# Patient Record
Sex: Female | Born: 1980 | Race: White | Hispanic: No | Marital: Single | Smoking: Current every day smoker
Health system: Southern US, Community
[De-identification: ages and names within clinical notes are randomized; demographics above are authoritative.]

## PROBLEM LIST (undated history)

## (undated) DIAGNOSIS — M549 Dorsalgia, unspecified: Secondary | ICD-10-CM

## (undated) DIAGNOSIS — K589 Irritable bowel syndrome without diarrhea: Secondary | ICD-10-CM

## (undated) DIAGNOSIS — E282 Polycystic ovarian syndrome: Secondary | ICD-10-CM

## (undated) DIAGNOSIS — F329 Major depressive disorder, single episode, unspecified: Secondary | ICD-10-CM

## (undated) DIAGNOSIS — M25569 Pain in unspecified knee: Secondary | ICD-10-CM

## (undated) DIAGNOSIS — G8929 Other chronic pain: Secondary | ICD-10-CM

## (undated) DIAGNOSIS — M5416 Radiculopathy, lumbar region: Secondary | ICD-10-CM

## (undated) DIAGNOSIS — F419 Anxiety disorder, unspecified: Secondary | ICD-10-CM

## (undated) DIAGNOSIS — F32A Depression, unspecified: Secondary | ICD-10-CM

## (undated) DIAGNOSIS — T7840XA Allergy, unspecified, initial encounter: Secondary | ICD-10-CM

## (undated) HISTORY — PX: APPENDECTOMY: SHX54

## (undated) HISTORY — DX: Allergy, unspecified, initial encounter: T78.40XA

---

## 2003-02-13 HISTORY — PX: SPINE SURGERY: SHX786

## 2003-09-01 ENCOUNTER — Ambulatory Visit (HOSPITAL_COMMUNITY): Admission: RE | Admit: 2003-09-01 | Discharge: 2003-09-02 | Payer: Self-pay | Admitting: Neurological Surgery

## 2003-12-13 ENCOUNTER — Encounter: Admission: RE | Admit: 2003-12-13 | Discharge: 2003-12-13 | Payer: Self-pay | Admitting: Neurological Surgery

## 2005-01-31 ENCOUNTER — Emergency Department (HOSPITAL_COMMUNITY): Admission: EM | Admit: 2005-01-31 | Discharge: 2005-01-31 | Payer: Self-pay | Admitting: Emergency Medicine

## 2005-03-01 ENCOUNTER — Encounter: Admission: RE | Admit: 2005-03-01 | Discharge: 2005-03-01 | Payer: Self-pay | Admitting: Neurology

## 2006-03-08 ENCOUNTER — Ambulatory Visit: Payer: Self-pay | Admitting: Psychiatry

## 2006-03-08 ENCOUNTER — Inpatient Hospital Stay (HOSPITAL_COMMUNITY): Admission: EM | Admit: 2006-03-08 | Discharge: 2006-03-10 | Payer: Self-pay | Admitting: Psychiatry

## 2006-10-26 ENCOUNTER — Emergency Department: Payer: Self-pay | Admitting: Unknown Physician Specialty

## 2007-07-10 ENCOUNTER — Emergency Department (HOSPITAL_COMMUNITY): Admission: EM | Admit: 2007-07-10 | Discharge: 2007-07-10 | Payer: Self-pay | Admitting: Emergency Medicine

## 2007-07-23 ENCOUNTER — Emergency Department (HOSPITAL_COMMUNITY): Admission: EM | Admit: 2007-07-23 | Discharge: 2007-07-23 | Payer: Self-pay | Admitting: Emergency Medicine

## 2008-02-11 ENCOUNTER — Emergency Department (HOSPITAL_COMMUNITY): Admission: EM | Admit: 2008-02-11 | Discharge: 2008-02-11 | Payer: Self-pay | Admitting: Emergency Medicine

## 2008-05-06 ENCOUNTER — Emergency Department (HOSPITAL_COMMUNITY): Admission: EM | Admit: 2008-05-06 | Discharge: 2008-05-06 | Payer: Self-pay | Admitting: Emergency Medicine

## 2008-05-26 ENCOUNTER — Emergency Department (HOSPITAL_COMMUNITY): Admission: EM | Admit: 2008-05-26 | Discharge: 2008-05-26 | Payer: Self-pay | Admitting: Family Medicine

## 2008-06-05 ENCOUNTER — Encounter: Admission: RE | Admit: 2008-06-05 | Discharge: 2008-06-05 | Payer: Self-pay | Admitting: Internal Medicine

## 2008-06-09 ENCOUNTER — Observation Stay (HOSPITAL_COMMUNITY): Admission: EM | Admit: 2008-06-09 | Discharge: 2008-06-09 | Payer: Self-pay | Admitting: Family Medicine

## 2008-06-28 ENCOUNTER — Emergency Department (HOSPITAL_COMMUNITY): Admission: EM | Admit: 2008-06-28 | Discharge: 2008-06-28 | Payer: Self-pay | Admitting: Emergency Medicine

## 2008-08-24 ENCOUNTER — Emergency Department (HOSPITAL_COMMUNITY): Admission: EM | Admit: 2008-08-24 | Discharge: 2008-08-24 | Payer: Self-pay | Admitting: Emergency Medicine

## 2010-03-05 ENCOUNTER — Encounter: Payer: Self-pay | Admitting: Neurology

## 2010-06-30 NOTE — Op Note (Signed)
NAME:  [name], [name]                          ACCOUNT NO.:  [id number]   MEDICAL RECORD NO.:  [id number]                   PATIENT TYPE:  OIB   LOCATION:  NA                                   FACILITY:  [facility]   PHYSICIAN:  Tia Alert, MD                  DATE OF BIRTH:  [birth year]   DATE OF PROCEDURE:  DATE OF DISCHARGE:                                 OPERATIVE REPORT   PREOPERATIVE DIAGNOSIS:  Lumbar disk herniation, L5-S1, on the left, with  left S1 radiculopathy.   POSTOPERATIVE DIAGNOSIS:  Lumbar disk herniation, L5-S1, on the left, with  left S1 radiculopathy.   PROCEDURE:  Lumbar hemilaminotomy, medial facetectomy, and foraminotomy, L5-  S1 on the left, followed by microdiskectomy, L5-S1 on the left, utilizing  microscopic dissection.   SURGEON:  Tia Alert, MD.   ASSISTANT:  Donalee Citrin, MD.   ANESTHESIA:  General endotracheal.   COMPLICATIONS:  None apparent.   INDICATIONS FOR PROCEDURE:  [name] is a 30 year old white female, who  presented to the [facility] with the complaints of severe left  lower extremity pain in an S1 distribution.  She had an MRI, which showed a  herniated disk at L5-S1 on the left with compression of the left S1 nerve  root.  She had tried medical management for quite some time without any  significant relief.  Her pain was quite severe and was interfering with her  quality of life.  Despite her young age, I felt she was best treated with a  microdiskectomy at L5-S1 on the left side.  She understood the risks, the  benefits, and alternatives to the surgery and wished to proceed.   DESCRIPTION OF PROCEDURE:  The patient was taken to the operating room and  after induction of adequate general endotracheal anesthesia, she was rolled  into the prone position onto the Wilson frame and all pressure points were  padded.  Her lumbar region was prepped with Duraprep and then draped in the  usual sterile fashion, 10 mL of local  anesthesia was injected, and then a  dorsal midline incision was made and carried down to the lumbosacral fascia,  which was opened, and the paraspinous musculature was taken down in a  subperiosteal fashion to expose the L5-S1 interspace on the left side.  Intraoperative x-ray confirmed our level at L5-S1 on the left, and then a  hemilaminotomy, a medial facetectomy, and foraminotomy were performed at L5-  S1 on the left utilizing the Kerrison punch.  The __________ligament was  identified, opened, and removed to expose the underlying dura and a left S1  nerve root.  The nerve root was retracted medially, and the epidural venous  vasculature was coagulated and cut sharply.  A large subannular disk  herniation was then identified and the annulus was incised, and a thorough  intradiskal diskectomy was performed with pituitary rongeurs  and curettes.  Once the diskectomy was complete, we used a nerve hook and a coronary  dilator to palpate to assure there were no more free fragments.  There was  no more nerve root compression, and the nerve root was free and pulsatile.  We irrigated with copious amounts of Bacitracin containing saline solution,  then lined the dura with Duragen, removed the retractor, and closed the  fascia with interrupted #1 Vicryl.  We closed the subcutaneous and  subcuticular tissue with 2-0 and 3-0 Vicryl, and closed the  skin with Dermabond.  The drapes were removed, a sterile dressing was  applied, the patient was awakened from general anesthesia and transported to  the recovery room in stable condition.  At the end of the procedure, all  sponge, needle, and instrument counts were correct.                                               Tia Alert, MD    DSJ/MEDQ  D:  09/01/2003  T:  09/01/2003  Job:  [id number]

## 2010-06-30 NOTE — Discharge Summary (Signed)
NAME:  Kristin, Alvarez                ACCOUNT NO.:  [id number]   MEDICAL RECORD NO.:  [id number]          PATIENT TYPE:  IPS   LOCATION:  0503                          [facility]   PHYSICIAN:  Anselm Jungling, MD  DATE OF BIRTH:  05/03/1980   DATE OF ADMISSION:  03/08/2006  DATE OF DISCHARGE:  03/10/2006                               DISCHARGE SUMMARY   IDENTIFYING DATA/REASON FOR ADMISSION:  This was an inpatient  psychiatric admission for [name], a 30 year old single female who was  admitted due to severe and increasing symptoms of anxiety and panic.  She likened the sensation to an elephant sitting on my chest.  She had  had some treatment with antidepressant medication without good results.   The patient was admitted because she had verbalized the presence of some  suicidal thoughts.  However, she denied any actual suicide plan or  intent.  Please refer to the admission note for further details  pertaining to the symptoms, circumstances, and history that led to her  hospitalization.  She was given an initial Axis I diagnosis of anxiety  disorder, NOS.   MEDICAL AND LABORATORY:  The patient was medically and physically  assessed by the psychiatric physician's assistant.  He noted an increase  in 30 pounds over 7 months, it was not clear what this was attributable  to but we could not rule out the possibility that it might have been due  to a response to psychotropic medications.  The patient had had a  history of strep throat 3 weeks prior.  She had a history of asthma,  irritable bowel syndrome, irregular menses, back pain, and frequent  sinus and tension headaches.   On admission, her white count was noted to be mildly elevated at 11,200  but other laboratory were essentially within normal limits.  She  appeared to be in good health without any active or chronic medical  problems.  There were no significant medical issues during this brief  inpatient stay.   HOSPITAL  COURSE:  The patient was admitted to the Adult Inpatient  Psychiatric Service.  She presented as a well-nourished, well-developed,  young woman who was very well organized.  She appeared to be of above-  average intelligence, was quite articulate and expressive.  She was  initially evaluated by Dr. Milford Cage.  In the initial assessment,  she described her anxiety symptoms to Dr. Katrinka Blazing as well as her  depressive symptoms.  Dr. Katrinka Blazing suggested a trial of Vistaril t.i.d. to  address anxiety symptoms.  The patient had admitted some occasional  marijuana use to Dr. Katrinka Blazing and apparently, because of this, Dr. Katrinka Blazing  was uncomfortable prescribing any benzodiazepines though the patient had  stated that of Valium, when given on occasion in the past, had helped  her anxiety greatly.  Dr. Katrinka Blazing noted that the patient was argumentative  about her decision not to prescribe benzodiazepines.   On the second full hospital day, Dr. Katrinka Blazing asked the undersigned if I  would be willing to see the patient that day owing to the patient having  made many subsequent verbalizations indicating her displeasure with Dr.  Katrinka Blazing.  I consented to do so and, when I approached the patient, she  indicated that she was willing to speak with me instead of Dr. Katrinka Blazing.  By this time, the patient had been talking to staff about her desire for  discharge that day, March 10, 2006.   I met with the patient face-to-face for approximately 45 minutes.  We  discussed all aspects of her treatment.  She indicated a great deal of  dissatisfaction of frustration around her hospital course including not  feeling that she was oriented properly to the inpatient service on  arrival.  Mostly, we talked about the psychotropic medications that had  been prescribed for her and answered questions that she had about these.   She stated that she did not find Vistaril helpful for her anxiety and  that it gave her a very dry mouth.  She had also  been prescribed  Seroquel, 25 mg at bedtime.  We discussed the pros and cons of  continuing this.   We discussed the Lamictal, 25 mg, that the patient had been initiated on  on a trial basis as well as pros and cons of this.   The patient and I discussed her marijuana usage.  She described it is  something that was very occasional, only something that she did once in  awhile at parties, but she did not have any pattern of buying, keeping,  or using marijuana on any regular basis.  She denied abuse of other  drugs and alcohol.  She mentioned that substance abuse runs rampant in  her family, she is very aware of it and very concerned about the  possibility that she could become chemically dependent but indicated  that she does not see herself as chemically dependent.   We discussed her past response to Valium, which she stated was very good  at a relatively low dose of 2.5 mg per dose.  She had not used Valium on  any extended basis.  I told the patient that, based on what she had told  me about her marijuana usage and her general stance towards drugs and  alcohol, that I did not believe that she was actually a chemically  dependent person and that Valium might be a consideration for her, or  some other equivalent benzodiazepine.  However, at this point the  patient was talking about being discharged that day.  I explained to her  that I did not feel comfortable writing her a prescription for Valium  and sending her home without the medication's effects being tested and  observed in the inpatient setting.  The patient had also been talking  about wanting to return to work the following morning.  I told her that,  if I did prescribe Valium or some other benzodiazepine for her, I could  not guarantee that she would be in proper shape to function at work or  drive herself safely to work the following morning.  The patient agreed  with this precaution.  I reassured the patient that we will  be setting up a referral to a  psychiatrist for medication management as soon as it could be arranged.  (We were speaking on a Sunday and I informed her that our case manager  would make such appointments the following day, Monday, and call her.)   At the end of her conversation, the patient and I agreed that she would  be sent  home with only 2 medications, Seroquel and Ambien.  We agreed  that she could discuss the possibility of a benzodiazepine trial and/or  Lamictal trial with her psychiatrist, to be arranged.  The patient had  tolerated the 25 mg of Seroquel without any undue sedation the following  morning.  I instructed her to take Seroquel, 25 mg at bedtime, and  increase to 50 mg at bedtime as needed for better sleep.  I also  indicated that she could use Ambien, 10 mg, but only as needed for sleep  and that my recommendation was to utilize Seroquel to the 50 mg dose  first before combining the 2.  The patient agreed to this.   The patient was discharged following our discussion.  Although she felt  better overall about things after our talk, she indicated that she still  felt quite frustrated about various particulars having to do with her  care while she was hospitalized with [place].  She requested, and I gave her  the names of our Passenger transport manager and our director of nursing.  She indicated that she would probably contact them to discuss her  concerns.   AFTERCARE:  As the patient was discharged on a Sunday, it was not  possible to give her specific appointment dates and times but the  patient was told that our case manager would do so on the following day,  on Monday.  The patient gave me her cell phone number, which I  subsequently forwarded to the appropriate case manager.   DISCHARGE MEDICATIONS:  1. Seroquel 25 mg q.h.s. increased to 50 mg as needed for insomnia.  2. Ambien 10 mg only as needed for sleep if Seroquel not effective.   DISCHARGE DIAGNOSES:  AXIS  I: Anxiety disorder, not otherwise specified.  AXIS II: Deferred.  AXIS III: No acute or chronic illnesses.  AXIS IV: Stressors severe.  AXIS V: Global Assessment of Functioning (GAF) on discharge 80.      Anselm Jungling, MD  Electronically Signed     SPB/MEDQ  D:  03/11/2006  T:  03/11/2006  Job:  [id number]

## 2010-11-17 LAB — URINALYSIS, ROUTINE W REFLEX MICROSCOPIC
Bilirubin Urine: NEGATIVE
Glucose, UA: NEGATIVE mg/dL
Hgb urine dipstick: NEGATIVE
Ketones, ur: NEGATIVE mg/dL
Nitrite: NEGATIVE
Protein, ur: NEGATIVE mg/dL
Specific Gravity, Urine: 1.01 (ref 1.005–1.030)
Urobilinogen, UA: 0.2 mg/dL (ref 0.0–1.0)
pH: 6.5 (ref 5.0–8.0)

## 2010-11-17 LAB — URINE CULTURE
Colony Count: NO GROWTH
Culture: NO GROWTH

## 2010-11-17 LAB — POCT PREGNANCY, URINE: Preg Test, Ur: NEGATIVE

## 2011-12-22 ENCOUNTER — Encounter (HOSPITAL_COMMUNITY): Payer: Self-pay | Admitting: Emergency Medicine

## 2011-12-22 ENCOUNTER — Emergency Department (HOSPITAL_COMMUNITY)
Admission: EM | Admit: 2011-12-22 | Discharge: 2011-12-22 | Disposition: A | Discharge status: Home Health | Payer: BC Managed Care – PPO | Attending: Emergency Medicine | Admitting: Emergency Medicine

## 2011-12-22 DIAGNOSIS — F411 Generalized anxiety disorder: Secondary | ICD-10-CM | POA: Insufficient documentation

## 2011-12-22 DIAGNOSIS — Z9889 Other specified postprocedural states: Secondary | ICD-10-CM | POA: Insufficient documentation

## 2011-12-22 DIAGNOSIS — M5416 Radiculopathy, lumbar region: Secondary | ICD-10-CM

## 2011-12-22 DIAGNOSIS — G8929 Other chronic pain: Secondary | ICD-10-CM | POA: Insufficient documentation

## 2011-12-22 DIAGNOSIS — F3289 Other specified depressive episodes: Secondary | ICD-10-CM | POA: Insufficient documentation

## 2011-12-22 DIAGNOSIS — F329 Major depressive disorder, single episode, unspecified: Secondary | ICD-10-CM | POA: Insufficient documentation

## 2011-12-22 DIAGNOSIS — F17200 Nicotine dependence, unspecified, uncomplicated: Secondary | ICD-10-CM | POA: Insufficient documentation

## 2011-12-22 DIAGNOSIS — IMO0002 Reserved for concepts with insufficient information to code with codable children: Secondary | ICD-10-CM | POA: Insufficient documentation

## 2011-12-22 HISTORY — DX: Anxiety disorder, unspecified: F41.9

## 2011-12-22 HISTORY — DX: Major depressive disorder, single episode, unspecified: F32.9

## 2011-12-22 HISTORY — DX: Depression, unspecified: F32.A

## 2011-12-22 MED ORDER — HYDROCODONE-ACETAMINOPHEN 5-500 MG PO TABS: 1.0000 | ORAL_TABLET | Freq: Four times a day (QID) | ORAL | Status: DC | PRN

## 2011-12-22 MED ORDER — HYDROMORPHONE HCL PF 2 MG/ML IJ SOLN
2.0000 mg | Freq: Once | INTRAMUSCULAR | Status: AC
Start: 2011-12-22 — End: 2011-12-22
  Administered 2011-12-22: 2 mg via INTRAMUSCULAR
  Filled 2011-12-22: qty 1

## 2011-12-22 NOTE — ED Notes (Signed)
Pt. Stated, i was playing with a child and I hurt my back which I have problems with it.  I've tried heat, ice, everything and nothing helps.

## 2011-12-22 NOTE — ED Provider Notes (Addendum)
History    This chart was scribed for Kristin Sprout, MD, MD by [name], ED Scribe. The patient was seen in room TR11C and the patient's care was started at 1:29PM.   CSN: [id number]  Arrival date & time 12/22/11  1301       Chief Complaint  Patient presents with  . Back Pain     Patient is a 31 y.o. female presenting with back pain. The history is provided by the patient. No language interpreter was used.  Back Pain  Pertinent negatives include no fever and no weakness.   [name] is a 32 y.o. female with hx of chronic back pain who presents to the Emergency Department complaining of constant, moderate lower back pain radiating to left ankle onset 2 days ago. Pt reports that she has surgery on lumbar spine in the past. She reports having nerve damage in bilateral feet. She denies bowel and urinary incontinence. She states that she has spasms on left side of back. Pt has used heat and ice without relief.    Dr. Yetta Barre ortho surgeon   Past Medical History  Diagnosis Date  . Depression   . Anxiety     History reviewed. No pertinent past surgical history.  No family history on file.  History  Substance Use Topics  . Smoking status: Current Every Day Smoker  . Smokeless tobacco: Not on file  . Alcohol Use: Yes    OB History    Grav Para Term Preterm Abortions TAB SAB Ect Mult Living                  Review of Systems  Constitutional: Negative for fever and chills.  Respiratory: Negative for shortness of breath.   Gastrointestinal: Negative for nausea and vomiting.  Musculoskeletal: Positive for back pain.  Neurological: Negative for weakness.  All other systems reviewed and are negative.    Allergies  Review of patient's allergies indicates not on file.  Home Medications  No current outpatient prescriptions on file.  BP 129/76  Pulse 105  Temp 98 F (36.7 C) (Oral)  Resp 22  SpO2 98%  LMP 12/15/2011  Physical Exam  Nursing note and vitals  reviewed. Constitutional: She is oriented to person, place, and time. She appears well-developed and well-nourished. No distress.  HENT:  Head: Normocephalic and atraumatic.  Eyes: EOM are normal.  Neck: Neck supple. No tracheal deviation present.  Cardiovascular: Normal rate.   Pulmonary/Chest: Effort normal. No respiratory distress.  Musculoskeletal: Normal range of motion.       l-spine and paraspinal tenderness Nl strength  Nl sensation +1  patellar reflex +2 DP and PT   Neurological: She is alert and oriented to person, place, and time.  Skin: Skin is warm and dry.  Psychiatric: She has a normal mood and affect. Her behavior is normal.    ED Course  Procedures (including critical care time) DIAGNOSTIC STUDIES: Oxygen Saturation is 98% on room air, normal by my interpretation.    COORDINATION OF CARE: 1:31 PM Discussed ED treatment with pt     Labs Reviewed - No data to display No results found.   1. Lumbar radiculopathy       MDM   Pt with gradual onset of back pain suggestive of radiculopathy.  No neurovascular compromise and no incontinence.  Pt has no infectious sx, hx of CA  or other red flags concerning for pathologic back pain.  Pt is able to ambulate but is painful.  Normal strength and reflexes on exam.  Denies trauma. Will give pt pain control and to return for developement of above sx.       I personally performed the services described in this documentation, which was scribed in my presence.  The recorded information has been reviewed and considered.    Kristin Sprout, MD 12/22/11 1339  Kristin Sprout, MD 12/22/11 1340

## 2011-12-25 ENCOUNTER — Ambulatory Visit: Payer: BC Managed Care – PPO | Admitting: Internal Medicine

## 2011-12-25 ENCOUNTER — Ambulatory Visit: Payer: BC Managed Care – PPO

## 2011-12-25 VITALS — BP 120/78 | HR 82 | Temp 98.4°F | Resp 18 | Ht 68.0 in | Wt 261.2 lb

## 2011-12-25 DIAGNOSIS — Z7189 Other specified counseling: Secondary | ICD-10-CM

## 2011-12-25 DIAGNOSIS — R202 Paresthesia of skin: Secondary | ICD-10-CM

## 2011-12-25 DIAGNOSIS — M545 Other chronic pain: Secondary | ICD-10-CM | POA: Insufficient documentation

## 2011-12-25 DIAGNOSIS — G8929 Other chronic pain: Secondary | ICD-10-CM | POA: Insufficient documentation

## 2011-12-25 DIAGNOSIS — M549 Dorsalgia, unspecified: Secondary | ICD-10-CM

## 2011-12-25 DIAGNOSIS — R209 Unspecified disturbances of skin sensation: Secondary | ICD-10-CM

## 2011-12-25 LAB — POCT URINALYSIS DIPSTICK
Bilirubin, UA: NEGATIVE
Glucose, UA: NEGATIVE
Ketones, UA: NEGATIVE
Leukocytes, UA: NEGATIVE
Nitrite, UA: NEGATIVE
Protein, UA: NEGATIVE
Spec Grav, UA: 1.025
Urobilinogen, UA: 0.2
pH, UA: 5.5

## 2011-12-25 LAB — POCT UA - MICROSCOPIC ONLY
Bacteria, U Microscopic: NEGATIVE
Casts, Ur, LPF, POC: NEGATIVE
Crystals, Ur, HPF, POC: NEGATIVE
Mucus, UA: NEGATIVE
Yeast, UA: NEGATIVE

## 2011-12-25 LAB — POCT URINE PREGNANCY: Preg Test, Ur: NEGATIVE

## 2011-12-25 MED ORDER — HYDROCODONE-ACETAMINOPHEN 7.5-325 MG PO TABS: 1.0000 | ORAL_TABLET | Freq: Three times a day (TID) | ORAL | Status: DC | PRN

## 2011-12-25 NOTE — Progress Notes (Signed)
  Subjective:    Patient ID: [name], female    DOB: 06-Jan-1981, 31 y.o.   MRN: [id number]  HPI Has chronic recurrent low back pain with radiation down left leg. Had surgery 11 years ago, had some permanent foot numbness and some bladder problems. Dr. Sandria Manly was her neurology and Dr. Yetta Barre her NS at that time. She requests referral to a spine Dr. Milinda Antis not regular, no incontinence In school and needs to finish in next 1 month Review of Systems     Objective:   Physical Exam  Vitals reviewed. Constitutional: She appears well-nourished. She appears distressed.  Eyes: EOM are normal.  Pulmonary/Chest: Effort normal.      [facility] reading (PRIMARY) by  Dr.Guest.Mild DDD, hx of surgery Results for orders placed in visit on 12/25/11  POCT URINE PREGNANCY      Component Value Range   Preg Test, Ur Negative    POCT UA - MICROSCOPIC ONLY      Component Value Range   WBC, Ur, HPF, POC 1-2     RBC, urine, microscopic 0-1     Bacteria, U Microscopic neg     Mucus, UA neg     Epithelial cells, urine per micros 4-6     Crystals, Ur, HPF, POC neg     Casts, Ur, LPF, POC neg     Yeast, UA neg    POCT URINALYSIS DIPSTICK      Component Value Range   Color, UA yellow     Clarity, UA clear     Glucose, UA neg     Bilirubin, UA neg     Ketones, UA neg     Spec Grav, UA 1.025     Blood, UA moderate     pH, UA 5.5     Protein, UA neg     Urobilinogen, UA 0.2     Nitrite, UA neg     Leukocytes, UA Negative         Assessment & Plan:  Refer to Dr. Ethelene Hal Back manual HC 7.5

## 2012-03-17 ENCOUNTER — Emergency Department (HOSPITAL_COMMUNITY)
Admission: EM | Admit: 2012-03-17 | Discharge: 2012-03-17 | Disposition: A | Discharge status: Home / Self Care | Payer: BC Managed Care – PPO | Attending: Emergency Medicine | Admitting: Emergency Medicine

## 2012-03-17 ENCOUNTER — Encounter (HOSPITAL_COMMUNITY): Payer: Self-pay | Admitting: Emergency Medicine

## 2012-03-17 ENCOUNTER — Emergency Department (HOSPITAL_COMMUNITY): Payer: BC Managed Care – PPO

## 2012-03-17 DIAGNOSIS — Y9301 Activity, walking, marching and hiking: Secondary | ICD-10-CM | POA: Insufficient documentation

## 2012-03-17 DIAGNOSIS — F3289 Other specified depressive episodes: Secondary | ICD-10-CM | POA: Insufficient documentation

## 2012-03-17 DIAGNOSIS — F411 Generalized anxiety disorder: Secondary | ICD-10-CM | POA: Insufficient documentation

## 2012-03-17 DIAGNOSIS — S8990XA Unspecified injury of unspecified lower leg, initial encounter: Secondary | ICD-10-CM | POA: Insufficient documentation

## 2012-03-17 DIAGNOSIS — M25561 Pain in right knee: Secondary | ICD-10-CM

## 2012-03-17 DIAGNOSIS — G8929 Other chronic pain: Secondary | ICD-10-CM | POA: Insufficient documentation

## 2012-03-17 DIAGNOSIS — Z79899 Other long term (current) drug therapy: Secondary | ICD-10-CM | POA: Insufficient documentation

## 2012-03-17 DIAGNOSIS — Y92009 Unspecified place in unspecified non-institutional (private) residence as the place of occurrence of the external cause: Secondary | ICD-10-CM | POA: Insufficient documentation

## 2012-03-17 DIAGNOSIS — F17200 Nicotine dependence, unspecified, uncomplicated: Secondary | ICD-10-CM | POA: Insufficient documentation

## 2012-03-17 DIAGNOSIS — W010XXA Fall on same level from slipping, tripping and stumbling without subsequent striking against object, initial encounter: Secondary | ICD-10-CM | POA: Insufficient documentation

## 2012-03-17 DIAGNOSIS — F329 Major depressive disorder, single episode, unspecified: Secondary | ICD-10-CM | POA: Insufficient documentation

## 2012-03-17 HISTORY — DX: Other chronic pain: G89.29

## 2012-03-17 HISTORY — DX: Radiculopathy, lumbar region: M54.16

## 2012-03-17 HISTORY — DX: Dorsalgia, unspecified: M54.9

## 2012-03-17 HISTORY — DX: Pain in unspecified knee: M25.569

## 2012-03-17 MED ORDER — NAPROXEN 250 MG PO TABS: 250.0000 mg | ORAL_TABLET | Freq: Two times a day (BID) | ORAL | Status: DC

## 2012-03-17 MED ORDER — KETOROLAC TROMETHAMINE 60 MG/2ML IM SOLN
60.0000 mg | Freq: Once | INTRAMUSCULAR | Status: AC
  Administered 2012-03-17: 60 mg via INTRAMUSCULAR
  Filled 2012-03-17: qty 2

## 2012-03-17 NOTE — ED Provider Notes (Signed)
History     CSN: [id number]  Arrival date & time 03/17/12  0328   First MD Initiated Contact with Patient 03/17/12 0345      Chief Complaint  Patient presents with  . Knee Injury     HPI Pt was seen at 0435.   Per pt, c/o gradual onset and persistence of constant acute flair of her chronic right knee "pain" for the past 2 days.  States the pain began when she "slipped on some ice" and "twisted funny."  Describes the pain as "spasms in my whole leg."  Denies focal motor weakness, no tingling/numbness in extremity, no open wounds, no edema, no ecchymosis, no fevers.      Past Medical History  Diagnosis Date  . Depression   . Anxiety   . Allergy   . Chronic back pain   . Chronic knee pain   . Lumbar radiculopathy     Past Surgical History  Procedure Date  . Appendectomy   . Spine surgery 2005    Family History  Problem Relation Age of Onset  . Hypertension Mother     History  Substance Use Topics  . Smoking status: Current Every Day Smoker -- 0.5 packs/day  . Smokeless tobacco: Not on file  . Alcohol Use: No    Review of Systems ROS: Statement: All systems negative except as marked or noted in the HPI; Constitutional: Negative for fever and chills. ; ; Eyes: Negative for eye pain, redness and discharge. ; ; ENMT: Negative for ear pain, hoarseness, nasal congestion, sinus pressure and sore throat. ; ; Cardiovascular: Negative for chest pain, palpitations, diaphoresis, dyspnea and peripheral edema. ; ; Respiratory: Negative for cough, wheezing and stridor. ; ; Gastrointestinal: Negative for nausea, vomiting, diarrhea, abdominal pain, blood in stool, hematemesis, jaundice and rectal bleeding. . ; ; Genitourinary: Negative for dysuria, flank pain and hematuria. ; ; Musculoskeletal: +right knee pain. Negative for back pain and neck pain. Negative for swelling and deformity.; ; Skin: Negative for pruritus, rash, abrasions, blisters, bruising and skin lesion.; ; Neuro: Negative  for headache, lightheadedness and neck stiffness. Negative for weakness, altered level of consciousness , altered mental status, extremity weakness, paresthesias, involuntary movement, seizure and syncope.       Allergies  Other  Home Medications   Current Outpatient Rx  Name  Route  Sig  Dispense  Refill  . CITALOPRAM HYDROBROMIDE 40 MG PO TABS   Oral   Take 40 mg by mouth daily.         Marland Kitchen DIAZEPAM 5 MG PO TABS   Oral   Take 2.5-5 mg by mouth every 12 (twelve) hours as needed. For anxiety/sleep         . OMEGA-3 FATTY ACIDS 1000 MG PO CAPS   Oral   Take 1 g by mouth 2 (two) times daily.         Marland Kitchen HYDROCODONE-ACETAMINOPHEN 7.5-325 MG PO TABS   Oral   Take 1 tablet by mouth every 8 (eight) hours as needed for pain.   45 tablet   1   . HYDROCODONE-ACETAMINOPHEN 5-500 MG PO TABS   Oral   Take 1-2 tablets by mouth every 6 (six) hours as needed for pain.   15 tablet   0   . ADULT MULTIVITAMIN W/MINERALS CH   Oral   Take 1 tablet by mouth daily.         Marland Kitchen NAPROXEN 250 MG PO TABS   Oral   Take 1  tablet (250 mg total) by mouth 2 (two) times daily with a meal.   14 tablet   0   . ZOLPIDEM TARTRATE 10 MG PO TABS   Oral   Take 15 mg by mouth at bedtime as needed. For insomnia           Wt 256 lb (116.121 kg)  LMP 03/07/2012  Physical Exam 0440: Physical examination:  Nursing notes reviewed; Vital signs and O2 SAT reviewed;  Constitutional: Well developed, Well nourished, Well hydrated, In no acute distress; Head:  Normocephalic, atraumatic; Eyes: EOMI, PERRL, No scleral icterus; ENMT: Mouth and pharynx normal, Mucous membranes moist; Neck: Supple, Full range of motion, No lymphadenopathy; Cardiovascular: Regular rate and rhythm, No murmur, rub, or gallop; Respiratory: Breath sounds clear & equal bilaterally, No rales, rhonchi, wheezes.  Speaking full sentences with ease, Normal respiratory effort/excursion; Chest: Nontender, Movement normal; Abdomen: Soft,  Nontender, Nondistended, Normal bowel sounds; Genitourinary: No CVA tenderness; Extremities: Pulses normal, +FROM right knee, including able to lift extended RLE off stretcher, and extend right lower leg against resistance.  No ligamentous laxity.  No patellar or quad tendon step-offs.  NMS intact right foot, strong pedal pp. +plantarflexion of right foot w/calf squeeze.  No palpable gap right Achilles's tendon.  No proximal fibular head tenderness.  No edema, erythema, warmth, ecchymosis or deformity.  No specific area of point tenderness. Pelvis stable.  NT right ankle/foot. No edema, No calf edema or asymmetry.; Neuro: AA&Ox3, Major CN grossly intact.  Speech clear. Gait steady. No gross focal motor or sensory deficits in extremities.; Skin: Color normal, Warm, Dry.   ED Course  Procedures     MDM  MDM Reviewed: previous chart, nursing note and vitals Interpretation: x-ray   Dg Knee Complete 4 Views Right 03/17/2012  *RADIOLOGY REPORT*  Clinical Data: Right knee pain after fall.  RIGHT KNEE - COMPLETE 4+ VIEW  Comparison: 12/26/2009  Findings: The right knee appears intact. No evidence of acute fracture or subluxation.  No focal bone lesions.  Bone matrix and cortex appear intact.  No abnormal radiopaque densities in the soft tissues.  No significant effusion.  No significant changes since the previous study.  IMPRESSION: No acute bony abnormalities.   Original Report Authenticated By: Burman Nieves, M.D.       [phone]:  Long hx of chronic right knee pain.  Pt endorses acute flair of her usual long standing chronic pain today, no change from her usual chronic pain pattern.  Pt encouraged to f/u with her PMD, Pain Management, and Orthopedic doctor for good continuity of care and control of her chronic pain.  Verb understanding.  Will place in knee immobilizer and crutches, with Ortho MD f/u.        Laray Anger, DO 03/17/12 2033

## 2012-03-17 NOTE — ED Notes (Signed)
Pts room mate here to be seen as well. Pt states she was in a pain clinic and wants to stay off opiates.

## 2012-03-17 NOTE — ED Notes (Signed)
Pt c/o R knee pain onset Friday after slipping on ice, pt states she has old injury but now having pain radiating to hip and ankle.

## 2012-03-17 NOTE — ED Notes (Signed)
Pt stated she has crutches at home. No crutches given at this time.

## 2012-06-26 ENCOUNTER — Encounter (HOSPITAL_COMMUNITY): Payer: Self-pay | Admitting: Emergency Medicine

## 2012-06-26 ENCOUNTER — Emergency Department (HOSPITAL_COMMUNITY)
Admission: EM | Admit: 2012-06-26 | Discharge: 2012-06-26 | Disposition: A | Discharge status: Home / Self Care | Payer: BC Managed Care – PPO | Attending: Emergency Medicine | Admitting: Emergency Medicine

## 2012-06-26 DIAGNOSIS — E282 Polycystic ovarian syndrome: Secondary | ICD-10-CM

## 2012-06-26 DIAGNOSIS — IMO0002 Reserved for concepts with insufficient information to code with codable children: Secondary | ICD-10-CM | POA: Insufficient documentation

## 2012-06-26 DIAGNOSIS — Z3202 Encounter for pregnancy test, result negative: Secondary | ICD-10-CM | POA: Insufficient documentation

## 2012-06-26 DIAGNOSIS — N938 Other specified abnormal uterine and vaginal bleeding: Secondary | ICD-10-CM

## 2012-06-26 DIAGNOSIS — Z79899 Other long term (current) drug therapy: Secondary | ICD-10-CM | POA: Insufficient documentation

## 2012-06-26 DIAGNOSIS — G8929 Other chronic pain: Secondary | ICD-10-CM | POA: Insufficient documentation

## 2012-06-26 DIAGNOSIS — F329 Major depressive disorder, single episode, unspecified: Secondary | ICD-10-CM | POA: Insufficient documentation

## 2012-06-26 DIAGNOSIS — R109 Unspecified abdominal pain: Secondary | ICD-10-CM | POA: Insufficient documentation

## 2012-06-26 DIAGNOSIS — F411 Generalized anxiety disorder: Secondary | ICD-10-CM | POA: Insufficient documentation

## 2012-06-26 DIAGNOSIS — M25569 Pain in unspecified knee: Secondary | ICD-10-CM | POA: Insufficient documentation

## 2012-06-26 DIAGNOSIS — R5381 Other malaise: Secondary | ICD-10-CM | POA: Insufficient documentation

## 2012-06-26 DIAGNOSIS — F17200 Nicotine dependence, unspecified, uncomplicated: Secondary | ICD-10-CM | POA: Insufficient documentation

## 2012-06-26 DIAGNOSIS — F3289 Other specified depressive episodes: Secondary | ICD-10-CM | POA: Insufficient documentation

## 2012-06-26 DIAGNOSIS — M549 Dorsalgia, unspecified: Secondary | ICD-10-CM | POA: Insufficient documentation

## 2012-06-26 LAB — URINE MICROSCOPIC-ADD ON

## 2012-06-26 LAB — CBC WITH DIFFERENTIAL/PLATELET
Basophils Relative: 0 % (ref 0–1)
Eosinophils Absolute: 0.2 10*3/uL (ref 0.0–0.7)
Eosinophils Relative: 2 % (ref 0–5)
HCT: 38.4 % (ref 36.0–46.0)
Hemoglobin: 13.4 g/dL (ref 12.0–15.0)
Lymphs Abs: 3.4 10*3/uL (ref 0.7–4.0)
MCH: 31.5 pg (ref 26.0–34.0)
MCHC: 34.9 g/dL (ref 30.0–36.0)
MCV: 90.1 fL (ref 78.0–100.0)
Monocytes Absolute: 0.8 10*3/uL (ref 0.1–1.0)
Monocytes Relative: 7 % (ref 3–12)
Neutrophils Relative %: 61 % (ref 43–77)
RBC: 4.26 MIL/uL (ref 3.87–5.11)

## 2012-06-26 LAB — URINALYSIS, ROUTINE W REFLEX MICROSCOPIC
Bilirubin Urine: NEGATIVE
Glucose, UA: NEGATIVE mg/dL
Ketones, ur: NEGATIVE mg/dL
Leukocytes, UA: NEGATIVE
Nitrite: NEGATIVE
Protein, ur: NEGATIVE mg/dL
Specific Gravity, Urine: 1.023 (ref 1.005–1.030)
Urobilinogen, UA: 0.2 mg/dL (ref 0.0–1.0)
pH: 5.5 (ref 5.0–8.0)

## 2012-06-26 LAB — BASIC METABOLIC PANEL
BUN: 9 mg/dL (ref 6–23)
Creatinine, Ser: 0.73 mg/dL (ref 0.50–1.10)
GFR calc non Af Amer: >90 mL/min (ref >90)
Glucose, Bld: 100 mg/dL — ABNORMAL HIGH (ref 70–99)
Potassium: 4 mEq/L (ref 3.5–5.1)

## 2012-06-26 LAB — PREGNANCY, URINE: Preg Test, Ur: NEGATIVE

## 2012-06-26 LAB — WET PREP, GENITAL: Clue Cells Wet Prep HPF POC: NONE SEEN

## 2012-06-26 MED ORDER — HYDROCODONE-ACETAMINOPHEN 10-325 MG PO TABS: 1.0000 | ORAL_TABLET | Freq: Once | ORAL | Status: DC

## 2012-06-26 MED ORDER — HYDROCODONE-ACETAMINOPHEN 5-325 MG PO TABS: 2.0000 | ORAL_TABLET | Freq: Four times a day (QID) | ORAL | Status: DC | PRN

## 2012-06-26 MED ORDER — NORETHINDRONE-ETH ESTRADIOL 0.4-35 MG-MCG PO TABS: 1.0000 | ORAL_TABLET | Freq: Every day | ORAL | Status: DC

## 2012-06-26 MED ORDER — ONDANSETRON 4 MG PO TBDP
4.0000 mg | ORAL_TABLET | Freq: Once | ORAL | Status: AC
  Administered 2012-06-26: 4 mg via ORAL
  Filled 2012-06-26: qty 1

## 2012-06-26 MED ORDER — HYDROCODONE-ACETAMINOPHEN 5-325 MG PO TABS
1.0000 | ORAL_TABLET | Freq: Once | ORAL | Status: AC
  Administered 2012-06-26: 1 via ORAL
  Filled 2012-06-26: qty 1

## 2012-06-26 NOTE — ED Notes (Signed)
Pt presenting to ed with c/o vaginal bleeding x 16 hours. Pt states she has used 25 tampons and panty liners in 16 hours. Pt states she has pcos and has abnormal periods. Pt states she feels fatigue.

## 2012-06-26 NOTE — ED Provider Notes (Signed)
History     CSN: [id number]  Arrival date & time 06/26/12  1608   First MD Initiated Contact with Patient 06/26/12 1616      Chief Complaint  Patient presents with  . Vaginal Bleeding    (Consider location/radiation/quality/duration/timing/severity/associated sxs/prior treatment) HPI  Patient presents to the ED for vaginal bleeding that is severe for 16 hours. 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 She said that she normal gets light spotting every few weeks. She has never had severe  Bleeding like this before and feels weak. She has 2 tampons in and has bleed through it within 30 minutes. She describes the toilet bowel being full of blood, and large and small clots coming out in the shower. Endorses mild suprapubic cramping and her chronic low back pain.   Past Medical History  Diagnosis Date  . Depression   . Anxiety   . Allergy   . Chronic back pain   . Chronic knee pain   . Lumbar radiculopathy     Past Surgical History  Procedure Laterality Date  . Appendectomy    . Spine surgery  2005    Family History  Problem Relation Age of Onset  . Hypertension Mother     History  Substance Use Topics  . Smoking status: Current Every Day Smoker -- 0.50 packs/day    Types: Cigarettes  . Smokeless tobacco: Not on file  . Alcohol Use: No    OB History   Grav Para Term Preterm Abortions TAB SAB Ect Mult Living                  Review of Systems  Genitourinary: Positive for vaginal bleeding.  All other systems reviewed and are negative.    Allergies  Other  Home Medications   Current Outpatient Rx  Name  Route  Sig  Dispense  Refill  . albuterol (PROVENTIL HFA;VENTOLIN HFA) 108 (90 BASE) MCG/ACT inhaler   Inhalation   Inhale 2 puffs into the lungs every 6 (six) hours as needed for wheezing.         . citalopram (CELEXA) 40 MG tablet   Oral   Take 40 mg by  mouth daily.         . diazepam (VALIUM) 5 MG tablet   Oral   Take 2.5-5 mg by mouth every 12 (twelve) hours as needed. For anxiety/sleep         . fish oil-omega-3 fatty acids 1000 MG capsule   Oral   Take 1 g by mouth 2 (two) times daily.         Marland Kitchen ibuprofen (ADVIL,MOTRIN) 200 MG tablet   Oral   Take 800 mg by mouth every 8 (eight) hours as needed for pain.         Marland Kitchen loratadine (CLARITIN) 10 MG tablet   Oral   Take 10 mg by mouth daily as needed for allergies.         . Multiple Vitamin (MULTIVITAMIN WITH MINERALS) TABS   Oral   Take 1 tablet by mouth daily.         Marland Kitchen zolpidem (AMBIEN) 10 MG tablet   Oral   Take 15 mg by mouth at bedtime as needed. For insomnia         . HYDROcodone-acetaminophen (NORCO/VICODIN) 5-325 MG per tablet   Oral   Take 2 tablets by mouth every 6 (six) hours  as needed for pain.   15 tablet   0   . norethindrone-ethinyl estradiol (OVCON-35, 28,) 0.4-35 MG-MCG tablet   Oral   Take 1 tablet by mouth daily.   1 Package   1     Ovcon Taper 1 tablet TID for first 3 days 1 tabl ...     BP 106/42  Pulse 113  Temp(Src) 98.7 F (37.1 C) (Oral)  Resp 24  SpO2 100%  Physical Exam  Nursing note and vitals reviewed. Constitutional: She appears well-developed and well-nourished. No distress.  HENT:  Head: Normocephalic and atraumatic.  Eyes: Pupils are equal, round, and reactive to light.  Neck: Normal range of motion. Neck supple.  Cardiovascular: Normal rate and regular rhythm.   Pulmonary/Chest: Effort normal.  Abdominal: Soft.  Genitourinary: Uterus is tender. Cervix exhibits no motion tenderness and no discharge. Right adnexum displays no mass, no tenderness and no fullness. Left adnexum displays no mass, no tenderness and no fullness. There is tenderness and bleeding around the vagina.  Neurological: She is alert.  Skin: Skin is warm and dry.    ED Course  Procedures (including critical care time)  Labs Reviewed   WET PREP, GENITAL - Abnormal; Notable for the following:    WBC, Wet Prep HPF POC FEW (*)    All other components within normal limits  URINALYSIS, ROUTINE W REFLEX MICROSCOPIC - Abnormal; Notable for the following:    APPearance CLOUDY (*)    Hgb urine dipstick LARGE (*)    All other components within normal limits  CBC WITH DIFFERENTIAL - Abnormal; Notable for the following:    WBC 11.5 (*)    All other components within normal limits  BASIC METABOLIC PANEL - Abnormal; Notable for the following:    Glucose, Bld 100 (*)    All other components within normal limits  URINE MICROSCOPIC-ADD ON - Abnormal; Notable for the following:    Squamous Epithelial / LPF FEW (*)    Bacteria, UA FEW (*)    All other components within normal limits  GC/CHLAMYDIA PROBE AMP  PREGNANCY, URINE   No results found.   1. PCOD (polycystic ovarian disease)   2. Dysfunctional uterine bleeding       MDM  Patients labs and pelvic unremarkable. Vitals are stable as well as hemoglobin.  [facility] said this is to be expected as people with POCD age without hormone treatment. She has given me recommendations for a [facility] prescription taper to  The bleeding. Patient needs to follow-up with a gynecologist because she needs f/u. Does not feel that ultrasound is necessary at this time.   Discussed with patient and her mother who feel comfortable with this plan.   norethindrone-ethinyl estradiol (OVCON-35, 28,) 0.4-35 MG-MCG tablet Take 1 tablet by mouth daily. 1 Package Vanice Rappa Irine Seal, PA-C  1 tab TID for days 1-3 1 tab BID for days 4-6 1 tab q day for remaining pack, skip placebo and start fresh pack when pack complete.    Pt has been advised of the symptoms that warrant their return to the ED. Patient has voiced understanding and has agreed to follow-up with the PCP or specialist.      Dorthula Matas, PA-C 06/26/12 1914

## 2012-06-26 NOTE — ED Notes (Signed)
PER telephone conversation with ultrasound staff, there will be a delay in care due to multiple pts that haven't yet been seen at [facility].

## 2012-06-26 NOTE — Progress Notes (Signed)
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 CM discussed and provided written information for self pay pcps, importance of pcp for f/u care, www.needymeds.org, discounted pharmacies and other [place] resources such as financial assistance, DSS and  health department  Reviewed resources for [employer] self pay pcps like [facility], family medicine at [address], [facility] family practice, general medical clinics, [facility] urgent care plus others, [facility] out patient pharmacies and housing Pt voiced understanding and appreciation of resources provided  Cm also discussed uninsured services at [facility] hospital clinic at [address] [place] [address]  [phone]. Pt and female at bedside voiced appreciate of resources provided   [facility] ED CM spoke with pt on how to obtain an in network pcp with insurance coverage via the customer service number or web site when she confirms coverage again through her college   ED patient advocate in to speak with pt and female at bedside

## 2012-06-27 LAB — GC/CHLAMYDIA PROBE AMP: GC Probe RNA: NEGATIVE

## 2012-06-28 NOTE — ED Provider Notes (Signed)
Medical screening examination/treatment/procedure(s) were performed by non-physician practitioner and as supervising physician I was immediately available for consultation/collaboration.  Raeford Razor, MD 06/28/12 [phone]

## 2012-07-08 ENCOUNTER — Encounter (HOSPITAL_COMMUNITY): Payer: Self-pay | Admitting: *Deleted

## 2012-07-08 ENCOUNTER — Inpatient Hospital Stay (HOSPITAL_COMMUNITY)
Admission: AD | Admit: 2012-07-08 | Discharge: 2012-07-08 | Disposition: A | Discharge status: Home / Self Care | Payer: BC Managed Care – PPO | Source: Ambulatory Visit | Attending: Obstetrics & Gynecology | Admitting: Obstetrics & Gynecology

## 2012-07-08 DIAGNOSIS — N949 Unspecified condition associated with female genital organs and menstrual cycle: Secondary | ICD-10-CM | POA: Insufficient documentation

## 2012-07-08 DIAGNOSIS — N970 Female infertility associated with anovulation: Secondary | ICD-10-CM

## 2012-07-08 DIAGNOSIS — R109 Unspecified abdominal pain: Secondary | ICD-10-CM | POA: Insufficient documentation

## 2012-07-08 DIAGNOSIS — N925 Other specified irregular menstruation: Secondary | ICD-10-CM | POA: Insufficient documentation

## 2012-07-08 DIAGNOSIS — M545 Low back pain: Secondary | ICD-10-CM

## 2012-07-08 DIAGNOSIS — F17200 Nicotine dependence, unspecified, uncomplicated: Secondary | ICD-10-CM

## 2012-07-08 DIAGNOSIS — N938 Other specified abnormal uterine and vaginal bleeding: Secondary | ICD-10-CM | POA: Insufficient documentation

## 2012-07-08 DIAGNOSIS — G8929 Other chronic pain: Secondary | ICD-10-CM

## 2012-07-08 HISTORY — DX: Polycystic ovarian syndrome: E28.2

## 2012-07-08 LAB — URINALYSIS, ROUTINE W REFLEX MICROSCOPIC
Bilirubin Urine: NEGATIVE
Ketones, ur: NEGATIVE mg/dL
Specific Gravity, Urine: 1.01 (ref 1.005–1.030)
Urobilinogen, UA: 0.2 mg/dL (ref 0.0–1.0)
pH: 6 (ref 5.0–8.0)

## 2012-07-08 LAB — CBC
HCT: 32.3 % — ABNORMAL LOW (ref 36.0–46.0)
MCV: 92.8 fL (ref 78.0–100.0)
RBC: 3.48 MIL/uL — ABNORMAL LOW (ref 3.87–5.11)
WBC: 9.9 10*3/uL (ref 4.0–10.5)

## 2012-07-08 LAB — URINE MICROSCOPIC-ADD ON

## 2012-07-08 MED ORDER — KETOROLAC TROMETHAMINE 30 MG/ML IJ SOLN
60.0000 mg | Freq: Once | INTRAMUSCULAR | Status: AC
  Administered 2012-07-08: 60 mg via INTRAMUSCULAR
  Filled 2012-07-08: qty 2

## 2012-07-08 MED ORDER — MEDROXYPROGESTERONE ACETATE 150 MG/ML IM SUSP
150.0000 mg | Freq: Once | INTRAMUSCULAR | Status: AC
  Administered 2012-07-08: 150 mg via INTRAMUSCULAR
  Filled 2012-07-08: qty 1

## 2012-07-08 NOTE — MAU Note (Signed)
[facility] [address] D. Poe CNM made aware.

## 2012-07-08 NOTE — MAU Provider Note (Signed)
CC: Vaginal Bleeding    First Provider Initiated Contact with Patient 07/08/12 1710      HPI [name] is a 32 y.o.  who presents with hx of PCOS and bleeding heavily and continuously for 13 days. Seen at [facility] 06/26/12 for DUB and was started on OCP taper. Bleeding diminished when taking 3 pills per day (x 3 d) but after starting 2/d it got heavier again. For last 2 days she  has increased the Ovcon to 2/d without effect. Has taken ibuprofen for cramps, but pain is increased over past 2-3 days.  Uses 2 tampons/hour. Feels fatigued and weak but denies orthostatic sx. Estimates 4-6 menstrual periods in past years. Previously 2-3/year.  Taking iron.  Anxiety is worse over past 2 weeks. Takes prn Valium. Plans to make appointment psychiatrist in am. FT student [employer]. Motivated to quit smoking.  Past Medical History  Diagnosis Date  . Depression   . Anxiety   . Allergy   . Chronic back pain   . Chronic knee pain   . Lumbar radiculopathy     OB History   Grav Para Term Preterm Abortions TAB SAB Ect Mult Living                  Past Surgical History  Procedure Laterality Date  . Appendectomy    . Spine surgery  2005    History   Social History  . Marital Status: Single    Spouse Name: N/A    Number of Children: N/A  . Years of Education: N/A   Occupational History  . Not on file.   Social History Main Topics  . Smoking status: Current Every Day Smoker -- 0.50 packs/day    Types: Cigarettes  . Smokeless tobacco: Not on file  . Alcohol Use: No  . Drug Use: No  . Sexually Active: Not on file   Other Topics Concern  . Not on file   Social History Narrative  . No narrative on file    No current facility-administered medications on file prior to encounter.   Current Outpatient Prescriptions on File Prior to Encounter  Medication Sig Dispense Refill  . albuterol (PROVENTIL HFA;VENTOLIN HFA) 108 (90 BASE) MCG/ACT inhaler Inhale 2 puffs into the lungs every 6 (six)  hours as needed for wheezing.      . citalopram (CELEXA) 40 MG tablet Take 40 mg by mouth daily.      . diazepam (VALIUM) 5 MG tablet Take 2.5-5 mg by mouth every 12 (twelve) hours as needed. For anxiety/sleep      . HYDROcodone-acetaminophen (NORCO/VICODIN) 5-325 MG per tablet Take 2 tablets by mouth every 6 (six) hours as needed for pain.  15 tablet  0  . ibuprofen (ADVIL,MOTRIN) 200 MG tablet Take 800 mg by mouth every 8 (eight) hours as needed for pain.      Marland Kitchen loratadine (CLARITIN) 10 MG tablet Take 10 mg by mouth daily as needed for allergies.      . Multiple Vitamin (MULTIVITAMIN WITH MINERALS) TABS Take 1 tablet by mouth daily.      Marland Kitchen zolpidem (AMBIEN) 10 MG tablet Take 15 mg by mouth at bedtime as needed. For insomnia      . fish oil-omega-3 fatty acids 1000 MG capsule Take 1 g by mouth 2 (two) times daily.        Allergies  Allergen Reactions  . Prednisone Other (See Comments)    Mania;  ALL STEROIDS    ROS  Pertinent items in HPI  PHYSICAL EXAM Filed Vitals:   07/08/12 1637  BP: 114/58  Pulse: 72  Temp: 98.9 F (37.2 C)  Resp: 20   General: Obese female in no acute distress Cardiovascular: Normal rate Respiratory: Normal effort Abdomen: Soft, nontender Back: No CVAT Extremities: No edema Neurologic: Alert and oriented Speculum exam: NEFG; vagina with moderate amount blood; cervix clean Bimanual exam: cervix closed, no CMT; uterus and adnexae slightly tender, unable to appreciate enlargement or masses due to body habitus   LAB RESULTS Results for orders placed during the hospital encounter of 07/08/12 (from the past 24 hour(s))  URINALYSIS, ROUTINE W REFLEX MICROSCOPIC     Status: Abnormal   Collection Time    07/08/12  5:06 PM      Result Value Range   Color, Urine RED (*) YELLOW   APPearance CLOUDY (*) CLEAR   Specific Gravity, Urine 1.010  1.005 - 1.030   pH 6.0  5.0 - 8.0   Glucose, UA NEGATIVE  NEGATIVE mg/dL   Hgb urine dipstick LARGE (*) NEGATIVE    Bilirubin Urine NEGATIVE  NEGATIVE   Ketones, ur NEGATIVE  NEGATIVE mg/dL   Protein, ur NEGATIVE  NEGATIVE mg/dL   Urobilinogen, UA 0.2  0.0 - 1.0 mg/dL   Nitrite NEGATIVE  NEGATIVE   Leukocytes, UA NEGATIVE  NEGATIVE  URINE MICROSCOPIC-ADD ON     Status: None   Collection Time    07/08/12  5:06 PM      Result Value Range   Squamous Epithelial / LPF RARE  RARE   RBC / HPF TOO NUMEROUS TO COUNT  <3 RBC/hpf   Urine-Other FIELD OBSCURED BY RBC'S    POCT PREGNANCY, URINE     Status: None   Collection Time    07/08/12  5:10 PM      Result Value Range   Preg Test, Ur NEGATIVE  NEGATIVE  CBC     Status: Abnormal   Collection Time    07/08/12  5:40 PM      Result Value Range   WBC 9.9  4.0 - 10.5 K/uL   RBC 3.48 (*) 3.87 - 5.11 MIL/uL   Hemoglobin 11.0 (*) 12.0 - 15.0 g/dL   HCT 16.1 (*) 09.6 - 04.5 %   MCV 92.8  78.0 - 100.0 fL   MCH 31.6  26.0 - 34.0 pg   MCHC 34.1  30.0 - 36.0 g/dL   RDW 40.9  81.1 - 91.4 %   Platelets 213  150 - 400 K/uL  (06/26/12 hgb 13.4) POCT glucose 81 (glucosuria last visit)  IMAGING No results found.  MAU COURSE Toradol for pain> relief Depoprovera given  ASSESSMENT  No diagnosis found.  PLAN D/W Dr. Marice Potter Will check TSH here; add Depoprovera and schedule for outpatient pelvic US with F/U [facility]. Discharge home. See AVS for patient education. Encouraged smoking cessation   Medication List    ASK your doctor about these medications       albuterol 108 (90 BASE) MCG/ACT inhaler  Commonly known as:  PROVENTIL HFA;VENTOLIN HFA  Inhale 2 puffs into the lungs every 6 (six) hours as needed for wheezing.     citalopram 40 MG tablet  Commonly known as:  CELEXA  Take 40 mg by mouth daily.     diazepam 5 MG tablet  Commonly known as:  VALIUM  Take 2.5-5 mg by mouth every 12 (twelve) hours as needed. For anxiety/sleep     fish oil-omega-3 fatty  acids 1000 MG capsule  Take 1 g by mouth 2 (two) times daily.     HYDROcodone-acetaminophen 5-325  MG per tablet  Commonly known as:  NORCO/VICODIN  Take 2 tablets by mouth every 6 (six) hours as needed for pain.     ibuprofen 200 MG tablet  Commonly known as:  ADVIL,MOTRIN  Take 800 mg by mouth every 8 (eight) hours as needed for pain.     loratadine 10 MG tablet  Commonly known as:  CLARITIN  Take 10 mg by mouth daily as needed for allergies.     multivitamin with minerals Tabs  Take 1 tablet by mouth daily.     norethindrone-ethinyl estradiol 0.4-35 MG-MCG tablet  Commonly known as:  OVCON-35,BALZIVA,BRIELLYN  Take 1 tablet by mouth daily.     zolpidem 10 MG tablet  Commonly known as:  AMBIEN  Take 15 mg by mouth at bedtime as needed. For insomnia          Danae Orleans, CNM 07/08/2012 5:39 PM

## 2012-07-08 NOTE — MAU Note (Signed)
Going on for awhile.  Has PCOS. Has been bleding for 13 days.  Was given BCP started at 3/d then went to 2/d when decreased bleeding got heavier and now passing clots.   Feels like crap.

## 2012-07-09 ENCOUNTER — Telehealth: Payer: Self-pay | Admitting: *Deleted

## 2012-07-09 DIAGNOSIS — N938 Other specified abnormal uterine and vaginal bleeding: Secondary | ICD-10-CM

## 2012-07-09 DIAGNOSIS — Z8742 Personal history of other diseases of the female genital tract: Secondary | ICD-10-CM

## 2012-07-09 LAB — GC/CHLAMYDIA PROBE AMP: CT Probe RNA: NEGATIVE

## 2012-07-09 LAB — TSH: TSH: 0.89 u[IU]/mL (ref 0.350–4.500)

## 2012-07-09 LAB — GLUCOSE, CAPILLARY: Glucose-Capillary: 81 mg/dL (ref 70–99)

## 2012-07-09 NOTE — Telephone Encounter (Signed)
Ordered Ultrasound and scheduled for 07/14/12 at 10:30. Called patient and left message we are calling with some information from your provider- please call clinic. Also needs follow up in office  Appt.

## 2012-07-09 NOTE — Telephone Encounter (Signed)
Flags: Schedule follow-up appointment      [name]     MRN: [id number] DOB: February 28, 1980     Pt Home: [phone]                Message    Please schedule pelvic US before appointment

## 2012-07-10 ENCOUNTER — Telehealth: Payer: Self-pay | Admitting: *Deleted

## 2012-07-10 NOTE — Telephone Encounter (Signed)
Called patient back and informed her we were calling to notify her of Ultrasound appointment and gyn follow up in our clinic - gave her both appointments.  Patient also requested other test results, given. Also states does she have to come to gyn follow up or can she go to [facility] clinic which will be cheaper for her.   Encouraged  Her to get ultrasound as ordered and may request results of ultrasound a few days later; also encouraged her to get follow up at either place , but if she is still having dub to come to our clinic as she states [facility] does not want to see her until bleeding is under control. Patient voices understanding.

## 2012-07-10 NOTE — Telephone Encounter (Signed)
Patient left a message that she is returning our call.

## 2012-07-14 ENCOUNTER — Ambulatory Visit (HOSPITAL_COMMUNITY)
Admission: RE | Admit: 2012-07-14 | Discharge: 2012-07-14 | Disposition: A | Discharge status: Home / Self Care | Payer: BC Managed Care – PPO | Source: Ambulatory Visit | Attending: Obstetrics and Gynecology | Admitting: Obstetrics and Gynecology

## 2012-07-14 DIAGNOSIS — E669 Obesity, unspecified: Secondary | ICD-10-CM | POA: Insufficient documentation

## 2012-07-14 DIAGNOSIS — N949 Unspecified condition associated with female genital organs and menstrual cycle: Secondary | ICD-10-CM | POA: Insufficient documentation

## 2012-07-14 DIAGNOSIS — N938 Other specified abnormal uterine and vaginal bleeding: Secondary | ICD-10-CM | POA: Insufficient documentation

## 2012-07-14 DIAGNOSIS — N925 Other specified irregular menstruation: Secondary | ICD-10-CM | POA: Insufficient documentation

## 2012-07-14 DIAGNOSIS — Z8742 Personal history of other diseases of the female genital tract: Secondary | ICD-10-CM

## 2012-07-14 DIAGNOSIS — E282 Polycystic ovarian syndrome: Secondary | ICD-10-CM | POA: Insufficient documentation

## 2012-07-15 ENCOUNTER — Telehealth: Payer: Self-pay | Admitting: General Practice

## 2012-07-15 NOTE — Telephone Encounter (Signed)
Called patient and informed her of 6/26 appt at 2:45 for ER follow up and to discuss ultrasound results. Patient verbalized understanding and had no further questions. Ultrasound done 6/2

## 2012-07-15 NOTE — Telephone Encounter (Signed)
Message copied by Kathee Delton on Tue Jul 15, 2012 11:34 AM ------      Message from: Danae Orleans      Created: Fri Jul 11, 2012  5:17 PM       1-2 weeks would be good. She tried OCPs to stop the bleeding and we added a depo shot here in MAU.      ----- Message -----         From: Kathee Delton, RN         Sent: 07/09/2012  11:55 AM           To: Danae Orleans, CNM            When does this patient need to follow up for an appointment in the clinic- I did not see anything in the MAU note and she does not have anything scheduled yet.            Thanks,      Nursing       ----- Message -----         From: Danae Orleans, CNM         Sent: 07/08/2012   6:28 PM           To: [facility] Clinical Pool            Please schedule pelvic US before appointment             ------

## 2012-08-07 ENCOUNTER — Ambulatory Visit (INDEPENDENT_AMBULATORY_CARE_PROVIDER_SITE_OTHER): Payer: BC Managed Care – PPO | Admitting: Obstetrics and Gynecology

## 2012-08-07 ENCOUNTER — Encounter: Payer: BC Managed Care – PPO | Admitting: Advanced Practice Midwife

## 2012-08-07 VITALS — BP 121/74 | HR 87 | Temp 98.8°F | Ht 68.0 in

## 2012-08-07 DIAGNOSIS — N938 Other specified abnormal uterine and vaginal bleeding: Secondary | ICD-10-CM | POA: Insufficient documentation

## 2012-08-07 DIAGNOSIS — N949 Unspecified condition associated with female genital organs and menstrual cycle: Secondary | ICD-10-CM

## 2012-08-07 DIAGNOSIS — N925 Other specified irregular menstruation: Secondary | ICD-10-CM

## 2012-08-07 DIAGNOSIS — E282 Polycystic ovarian syndrome: Secondary | ICD-10-CM | POA: Insufficient documentation

## 2012-08-07 LAB — POCT URINALYSIS DIP (DEVICE)
Ketones, ur: 15 mg/dL — AB
Leukocytes, UA: NEGATIVE
Nitrite: NEGATIVE
pH: 6 (ref 5.0–8.0)

## 2012-08-07 LAB — POCT PREGNANCY, URINE: Preg Test, Ur: NEGATIVE

## 2012-08-07 MED ORDER — MEDROXYPROGESTERONE ACETATE 10 MG PO TABS: 10.0000 mg | ORAL_TABLET | Freq: Every day | ORAL | Status: DC

## 2012-08-07 MED ORDER — HYDROCODONE-ACETAMINOPHEN 5-325 MG PO TABS: 2.0000 | ORAL_TABLET | Freq: Four times a day (QID) | ORAL | Status: DC | PRN

## 2012-08-07 NOTE — Progress Notes (Signed)
  Subjective:    Patient ID: [name], female    DOB: 03-Jan-1981, 32 y.o.   MRN: [id number]  HPI 32 yo with LMP 06/25/2012 presenting today as MAU follow up for management of DUB. Patient has a long standing history of irregular cycles since menarche at age 56. Patient was diagnosed with PCOS and was medically managed with OCP on and off. Patient with last period in May and has been bleeding heavy with passage of clots. Patient was started on OCP taper and depo-provera. Bleeding decreased to spotting but patient discontinued OCP as she felt her anxiety worsened. She also does not like the way she feels on the depo-provera either and is looking for other options. Patient is currently not bleeding and knows that she does not want to continue with depo-provera. Patient is also trying to establish care ant [facility].  Past Medical History  Diagnosis Date  . Depression   . Anxiety   . Allergy   . Chronic back pain   . Chronic knee pain   . Lumbar radiculopathy   . PCOS (polycystic ovarian syndrome)     Past Surgical History  Procedure Laterality Date  . Appendectomy    . Spine surgery  2005   Family History  Problem Relation Age of Onset  . Hypertension Mother    History  Substance Use Topics  . Smoking status: Current Every Day Smoker -- 0.50 packs/day    Types: Cigarettes  . Smokeless tobacco: Not on file  . Alcohol Use: No     Review of Systems  All other systems reviewed and are negative.       Objective:   Physical Exam  Patient declined   07/14/2012 ultrasound Findings:  Uterus: 7.7 x 3.5 x 4.4 cm. Normal appearance.  Endometrium: 11.3 mm, homogeneous in appearance.  Right ovary: 3.5 x 2.4 x 2.3 cm. Normal appearance.  Left ovary: 4.2 x 2.3 x 1.7 cm. Normal appearance.  Other findings: No free fluid  IMPRESSION:  Normal study. No evidence of pelvic mass or other significant  abnormality.     Assessment & Plan:  32 yo G0 with PCOS and DUB -  Discussed need to manage medically with birth control.  - Discussed Mirena IUD. Patient states she would like to research this product before insertion - Rx provera provided in case of menorrhagia - RTC for IUD insertion or establish care at [facility]

## 2012-08-07 NOTE — Patient Instructions (Signed)

## 2012-08-12 ENCOUNTER — Encounter: Payer: Self-pay | Admitting: *Deleted

## 2012-10-02 ENCOUNTER — Ambulatory Visit (INDEPENDENT_AMBULATORY_CARE_PROVIDER_SITE_OTHER): Payer: Self-pay | Admitting: Obstetrics & Gynecology

## 2012-10-02 ENCOUNTER — Encounter: Payer: Self-pay | Admitting: Obstetrics & Gynecology

## 2012-10-02 VITALS — BP 120/81 | HR 58 | Ht 68.0 in | Wt 238.5 lb

## 2012-10-02 DIAGNOSIS — N938 Other specified abnormal uterine and vaginal bleeding: Secondary | ICD-10-CM

## 2012-10-02 DIAGNOSIS — N949 Unspecified condition associated with female genital organs and menstrual cycle: Secondary | ICD-10-CM

## 2012-10-02 DIAGNOSIS — N925 Other specified irregular menstruation: Secondary | ICD-10-CM

## 2012-10-02 MED ORDER — OXYCODONE-ACETAMINOPHEN 5-325 MG PO TABS: 1.0000 | ORAL_TABLET | Freq: Four times a day (QID) | ORAL | Status: DC | PRN

## 2012-10-02 MED ORDER — MEGESTROL ACETATE 40 MG PO TABS: 80.0000 mg | ORAL_TABLET | Freq: Two times a day (BID) | ORAL | Status: DC

## 2012-10-02 NOTE — Progress Notes (Signed)
Arch application faxed to company and scanned into chart.  Phoned company.  Was told that application would need to be processed which takes 1-2 days and if the patient qualifies it would take another 5-10 days for the device to get to our office.

## 2012-10-02 NOTE — Patient Instructions (Signed)

## 2012-10-02 NOTE — Progress Notes (Signed)
Patient very upset and emotional at checkin.  Insurance verified by telephone because we were unable to get it to go through the automated system.  Insurance BCBS was terminated on 09/11/12.  Patient states this is not true.  I have tried to explain to patient that this information is what we have to go on.  Patient very upset, crying and as I try to talk to her and explain options she continues to interrupt me and won't let me finish talking.  I have explained options to the patient - charity care and access one plan.  She states she is in "excruciating pain" and is bleeding.  States she was told an IUD was the only option to take care of her problem.  Explained to patient we would apply for a free Mirena through the company - application completed by patient and faxed to company.  Explained to patient we cannot insert and IUD today but we would see her today and see if there was something we can do to treat her pain and bleeding.  Explained to patient we would not require her to make a payment today though she will receive a bill for services provided today.

## 2012-10-03 NOTE — Progress Notes (Signed)
GYNECOLOGY CLINIC PROGRESS NOTE  32 y.o. G0 here today for scheduled Mirena IUD insertion for treatment of DUB. However, due to insurance issues, she is unable to get this done today.  Patient is very angry and has talked to the clinic manager, Mayra Neer.  Please refer to her note for more details.  Patient reports having continued bleeding that makes her "want to kill myself".  She is very anxious and wants to have a hysterectomy today.  She is with her mother, who was trying to keep her calm.  Of note, patient does have a history of depression and anxiety, for which she is om medications and followed by a mental health provider.  Patient was irate and screaming through out the encounter. She denies having any plan to hurt herself or others, just claims that she is very angry at this moment.  Patient has filled out the application to obtain an IUD from [facility] foundation, this will get here hopefully in about two weeks. In the meantime, I counseled patient about trial of high dose megestrol to see if this will help.  She has tried OCPs and Provera 10 mg daily in the past which was not effective (Provera was underdosed; she needed a higher dosage given her habitus and severity of AUB).   After much, more screaming, she finally agreed to try this for now.  She was prescribed high dose megace, and told to remain at this dosage until IUD placement. Common side effects of Megestrol reviewed including possible mood changes.   Also prescribed Percocet as needed for pain; and was told to take Aleve as needed.  She was told not to take extra acetaminophen or other NSAIDs while taking the Percocet and Aleve.  Bleeding precautions reviewed.  Once the Mirena IUD is available, we will make every effort to place this soon after it is available. She was counseled that Mirena is associated with cramping and irregular bleeding for the first few months; she is aware of this and wants to proceed with this when it is  available.  Patient was also advised to contact her mental health provider to help with her mental state during this situation.   Total encounter time: 30 minutes  Jaynie Collins, MD, FACOG Attending Obstetrician & Gynecologist Faculty Practice, [facility]

## 2012-10-22 ENCOUNTER — Telehealth: Payer: Self-pay | Admitting: Obstetrics & Gynecology

## 2012-10-22 NOTE — Telephone Encounter (Signed)
First called patient on 10/21/12 to tell her about her appointment, but when a gentleman answered the phone he asked me to hold on then phone went dead. Called back no answer. Called today to inform patient about her appointment on 10/23/12, phone went to answering machine and would not let me leave a message because it had not been set up yet.

## 2012-10-23 ENCOUNTER — Ambulatory Visit: Payer: Self-pay | Admitting: Obstetrics & Gynecology

## 2012-10-23 ENCOUNTER — Telehealth: Payer: Self-pay | Admitting: Obstetrics & Gynecology

## 2012-10-23 NOTE — Telephone Encounter (Signed)
Called patient to inform her of her appointment today. Phone has an answering machine, and wasn't able to leave message because it wasn't set up.

## 2012-11-05 ENCOUNTER — Encounter: Payer: Self-pay | Admitting: *Deleted

## 2012-11-05 ENCOUNTER — Encounter: Payer: Self-pay | Admitting: Obstetrics & Gynecology

## 2012-11-05 ENCOUNTER — Ambulatory Visit (INDEPENDENT_AMBULATORY_CARE_PROVIDER_SITE_OTHER): Payer: Self-pay | Admitting: Obstetrics & Gynecology

## 2012-11-05 VITALS — BP 113/78 | HR 84 | Temp 99.7°F | Ht 68.0 in | Wt 239.9 lb

## 2012-11-05 DIAGNOSIS — R102 Pelvic and perineal pain: Secondary | ICD-10-CM

## 2012-11-05 DIAGNOSIS — Z30430 Encounter for insertion of intrauterine contraceptive device: Secondary | ICD-10-CM

## 2012-11-05 DIAGNOSIS — N949 Unspecified condition associated with female genital organs and menstrual cycle: Secondary | ICD-10-CM

## 2012-11-05 DIAGNOSIS — Z01812 Encounter for preprocedural laboratory examination: Secondary | ICD-10-CM

## 2012-11-05 MED ORDER — HYDROCODONE-ACETAMINOPHEN 7.5-750 MG PO TABS: 1.0000 | ORAL_TABLET | Freq: Four times a day (QID) | ORAL | Status: DC | PRN

## 2012-11-05 MED ORDER — DICLOFENAC POTASSIUM 50 MG PO TABS: 50.0000 mg | ORAL_TABLET | Freq: Three times a day (TID) | ORAL | Status: DC

## 2012-11-05 MED ORDER — LEVONORGESTREL 20 MCG/24HR IU IUD
INTRAUTERINE_SYSTEM | Freq: Once | INTRAUTERINE | Status: AC
  Administered 2012-11-05: 16:00:00 via INTRAUTERINE

## 2012-11-05 NOTE — Progress Notes (Signed)
GYNECOLOGY CLINIC PROCEDURE NOTE  [name] is a 32 y.o. G0P0000 here for Mirena IUD insertion. Pt has had abnormal bleeding and pain.  Has been on Megace.    IUD Insertion Procedure Note Patient identified, informed consent performed.  Discussed risks of irregular bleeding, cramping, infection, malpositioning or misplacement of the IUD outside the uterus which may require further procedures. Time out was performed.  Urine pregnancy test negative.  Speculum placed in the vagina.  Cervix visualized.  Cleaned with Betadine x 2.  Grasped anteriorly with a single tooth tenaculum.  Uterus sounded to 7 cm.  Mirena IUD placed per manufacturer's recommendations.  Strings trimmed to 3 cm. Tenaculum was removed, good hemostasis noted.  Patient tolerated procedure well.   Patient was given post-procedure instructions.  Patient was also asked to check IUD strings periodically and follow up in 4 weeks for IUD check. Pt was given a prescription for Cataflam 50mg  tid prn pain and Vicodin 7.5/750 1 po q 6 hours prn severe pain.  Pt was informed that there would not be refills on the Vicodin  Clytie Shetley L. Harraway-Smith, M.D., [facility]

## 2012-11-05 NOTE — Patient Instructions (Signed)

## 2012-11-07 ENCOUNTER — Encounter (HOSPITAL_COMMUNITY): Payer: Self-pay | Admitting: *Deleted

## 2012-11-07 ENCOUNTER — Inpatient Hospital Stay (HOSPITAL_COMMUNITY): Payer: Self-pay

## 2012-11-07 ENCOUNTER — Inpatient Hospital Stay (HOSPITAL_COMMUNITY)
Admission: AD | Admit: 2012-11-07 | Discharge: 2012-11-07 | Disposition: A | Discharge status: Home / Self Care | Payer: Self-pay | Source: Ambulatory Visit | Attending: Obstetrics & Gynecology | Admitting: Obstetrics & Gynecology

## 2012-11-07 DIAGNOSIS — N939 Abnormal uterine and vaginal bleeding, unspecified: Secondary | ICD-10-CM

## 2012-11-07 DIAGNOSIS — N938 Other specified abnormal uterine and vaginal bleeding: Secondary | ICD-10-CM | POA: Insufficient documentation

## 2012-11-07 DIAGNOSIS — N925 Other specified irregular menstruation: Secondary | ICD-10-CM | POA: Insufficient documentation

## 2012-11-07 DIAGNOSIS — N949 Unspecified condition associated with female genital organs and menstrual cycle: Secondary | ICD-10-CM | POA: Insufficient documentation

## 2012-11-07 DIAGNOSIS — R109 Unspecified abdominal pain: Secondary | ICD-10-CM | POA: Insufficient documentation

## 2012-11-07 DIAGNOSIS — Z30431 Encounter for routine checking of intrauterine contraceptive device: Secondary | ICD-10-CM | POA: Insufficient documentation

## 2012-11-07 HISTORY — DX: Irritable bowel syndrome without diarrhea: K58.9

## 2012-11-07 LAB — CBC
MCH: 30.5 pg (ref 26.0–34.0)
MCV: 88.7 fL (ref 78.0–100.0)
Platelets: 270 10*3/uL (ref 150–400)
RBC: 4.43 MIL/uL (ref 3.87–5.11)
RDW: 12.7 % (ref 11.5–15.5)
WBC: 11.9 10*3/uL — ABNORMAL HIGH (ref 4.0–10.5)

## 2012-11-07 LAB — WET PREP, GENITAL
Clue Cells Wet Prep HPF POC: NONE SEEN
Yeast Wet Prep HPF POC: NONE SEEN

## 2012-11-07 MED ORDER — OXYCODONE-ACETAMINOPHEN 5-325 MG PO TABS: 1.0000 | ORAL_TABLET | ORAL | Status: AC | PRN

## 2012-11-07 MED ORDER — KETOROLAC TROMETHAMINE 60 MG/2ML IM SOLN
60.0000 mg | Freq: Once | INTRAMUSCULAR | Status: AC
  Administered 2012-11-07: 60 mg via INTRAMUSCULAR
  Filled 2012-11-07: qty 2

## 2012-11-07 MED ORDER — OXYCODONE-ACETAMINOPHEN 5-325 MG PO TABS
2.0000 | ORAL_TABLET | Freq: Once | ORAL | Status: AC
  Administered 2012-11-07: 2 via ORAL
  Filled 2012-11-07: qty 2

## 2012-11-07 NOTE — MAU Note (Signed)
Patient presents with complaint of abdominal bleeding and cramping following the placement of an IUD approximately 48 hours ago.

## 2012-11-07 NOTE — MAU Provider Note (Signed)
Attestation of Attending Supervision of Advanced Practitioner (CNM/NP): Evaluation and management procedures were performed by the Advanced Practitioner under my supervision and collaboration.  I have reviewed the Advanced Practitioner's note and chart, and I agree with the management and plan.  HARRAWAY-SMITH, Adelise Buswell 9:10 PM     

## 2012-11-07 NOTE — MAU Provider Note (Signed)
History     CSN: [id number]  Arrival date and time: 11/07/12 1717   First Provider Initiated Contact with Patient 11/07/12 1849      Chief Complaint  Patient presents with  . Vaginal Bleeding  . Abdominal Pain   HPI  Ms. [name] is a non- pregnant female; G0P0000 who presents with vaginal bleeding and abdominal cramping. She had a mirena IUD placed on Wednesday 11/05/12. She began bleeding yesterday; throughout the day it was very heavy, and stopped last night. Today the bleeding is comparable to a normal to heavy period. She has been taking Vicodin every 6 hours and ibuprofen 800 mg every 6 hours; alternating them both. Shecurrently rates her pain a 6/10.  She has a very high pain tolerance and does not feel like the pain medication is helping. She would like to stick out the pain and keep the IUD.   OB History   Grav Para Term Preterm Abortions TAB SAB Ect Mult Living   0 0 0 0 0 0 0 0 0 0       Past Medical History  Diagnosis Date  . Depression   . Anxiety   . Allergy   . Chronic back pain   . Chronic knee pain   . Lumbar radiculopathy   . PCOS (polycystic ovarian syndrome)   . IBS (irritable bowel syndrome)     Past Surgical History  Procedure Laterality Date  . Appendectomy    . Spine surgery  2005    Family History  Problem Relation Age of Onset  . Hypertension Mother     History  Substance Use Topics  . Smoking status: Current Every Day Smoker -- 1.00 packs/day    Types: Cigarettes  . Smokeless tobacco: Not on file  . Alcohol Use: No    Allergies:  Allergies  Allergen Reactions  . Prednisone Other (See Comments)    Mania;  ALL STEROIDS    Prescriptions prior to admission  Medication Sig Dispense Refill  . citalopram (CELEXA) 40 MG tablet Take 40 mg by mouth daily.      Marland Kitchen HYDROcodone-acetaminophen (VICODIN ES) 7.5-750 MG per tablet Take 1 tablet by mouth every 6 (six) hours as needed for pain.  10 tablet  0  . ibuprofen (ADVIL,MOTRIN) 200  MG tablet Take 800 mg by mouth every 8 (eight) hours as needed for pain.      Marland Kitchen levonorgestrel (MIRENA) 20 MCG/24HR IUD 1 each by Intrauterine route once.      . Multiple Vitamin (MULTIVITAMIN WITH MINERALS) TABS Take 1 tablet by mouth daily.      . sodium chloride (OCEAN) 0.65 % nasal spray Place 1 spray into the nose 2 (two) times daily as needed for congestion.      Marland Kitchen albuterol (PROVENTIL HFA;VENTOLIN HFA) 108 (90 BASE) MCG/ACT inhaler Inhale 2 puffs into the lungs every 6 (six) hours as needed for wheezing or shortness of breath (rescue).        Results for orders placed during the hospital encounter of 11/07/12 (from the past 24 hour(s))  WET PREP, GENITAL     Status: Abnormal   Collection Time    11/07/12  7:00 PM      Result Value Range   Yeast Wet Prep HPF POC NONE SEEN  NONE SEEN   Trich, Wet Prep NONE SEEN  NONE SEEN   Clue Cells Wet Prep HPF POC NONE SEEN  NONE SEEN   WBC, Wet Prep HPF POC FEW (*) NONE  SEEN  CBC     Status: Abnormal   Collection Time    11/07/12  7:10 PM      Result Value Range   WBC 11.9 (*) 4.0 - 10.5 K/uL   RBC 4.43  3.87 - 5.11 MIL/uL   Hemoglobin 13.5  12.0 - 15.0 g/dL   HCT 16.1  09.6 - 04.5 %   MCV 88.7  78.0 - 100.0 fL   MCH 30.5  26.0 - 34.0 pg   MCHC 34.4  30.0 - 36.0 g/dL   RDW 40.9  81.1 - 91.4 %   Platelets 270  150 - 400 K/uL   Results for orders placed during the hospital encounter of 11/07/12 (from the past 24 hour(s))  WET PREP, GENITAL     Status: Abnormal   Collection Time    11/07/12  7:00 PM      Result Value Range   Yeast Wet Prep HPF POC NONE SEEN  NONE SEEN   Trich, Wet Prep NONE SEEN  NONE SEEN   Clue Cells Wet Prep HPF POC NONE SEEN  NONE SEEN   WBC, Wet Prep HPF POC FEW (*) NONE SEEN  CBC     Status: Abnormal   Collection Time    11/07/12  7:10 PM      Result Value Range   WBC 11.9 (*) 4.0 - 10.5 K/uL   RBC 4.43  3.87 - 5.11 MIL/uL   Hemoglobin 13.5  12.0 - 15.0 g/dL   HCT 78.2  95.6 - 21.3 %   MCV 88.7  78.0 - 100.0  fL   MCH 30.5  26.0 - 34.0 pg   MCHC 34.4  30.0 - 36.0 g/dL   RDW 08.6  57.8 - 46.9 %   Platelets 270  150 - 400 K/uL    US Transvaginal Non-ob  11/07/2012   CLINICAL DATA:  Intrauterine device inserted 2 days ago, patient presenting now with vaginal bleeding and cramping. G0. LMP approximately 4 months ago.  EXAM: TRANSVAGINAL ULTRASOUND OF PELVIS  TECHNIQUE: Transvaginal ultrasound examination of the pelvis was performed including evaluation of the uterus, ovaries, adnexal regions, and pelvic cul-de-sac.  COMPARISON:  Transabdominal and transvaginal pelvic ultrasound 07/14/2012.  FINDINGS: Uterus  Measurements: Approximately 6.9 x 3.8 x 4.3 cm. No focal myometrial abnormality.  Endometrium  Thickness: 9 mm. Intrauterine device positioned in the lower uterine segment and cervix. No endometrial fluid.  Right ovary  Measurements: Approximately 3.4 x 2.7 x 3.0 cm. No dominant cyst or solid mass. Normal color Doppler flow within the ovary.  Left ovary  Measurements: Approximately 3.7 x 1.8 x 2.4 cm. No dominant cyst or solid mass. Normal color Doppler flow within the ovary.  Other findings:  No free fluid.  IMPRESSION: 1. Intrauterine device positioned in the endometrium of the lower uterine segment and in the cervix, not within the fundal endometrium as expected. 2. Otherwise normal examination.   Electronically Signed   By: Hulan Saas   On: 11/07/2012 20:13    Review of Systems  Constitutional: Negative for fever and chills.  Gastrointestinal: Positive for nausea, abdominal pain and constipation. Negative for vomiting and diarrhea.  Genitourinary: Negative for dysuria, urgency, frequency and hematuria.       No vaginal discharge. + vaginal bleeding. No dysuria.    Physical Exam   Blood pressure 117/79, pulse 79, temperature 98.1 F (36.7 C), temperature source Oral, resp. rate 20, height 5\' 8"  (1.727 m), weight 107.956 kg (238 lb), SpO2 100.00%.  Physical Exam  Constitutional: She is  oriented to person, place, and time. She appears well-developed and well-nourished. No distress.  Neck: Neck supple.  GI: Soft. She exhibits no distension and no mass. There is no tenderness. There is no rebound and no guarding.  Genitourinary:  Speculum exam: Vagina - Small amount of bright red vaginal bleeding in vaginal canal; no fox swabs needed to visualize cervix. , no odor Cervix - Scant amount of active vaginal bleeding from cervical os; both IUD strings visualized  wet prep done Chaperone present for exam.   Neurological: She is alert and oriented to person, place, and time.  Skin: Skin is warm and dry. She is not diaphoretic.    MAU Course  Procedures None   MDM CBC Toradol 60 mg IM in MAU  Wet prep Pelvic US limited for IUD assessment  Pain is unchanged with the Toradol; pt continues to rate her pain 7/10: 2 percocet ordered Consulted with Dr. Erin Fulling; plan to given patient percocet for pain management at home, will have her follow up in the clinic and return if bleeding and/or pain does not improve.   Assessment and Plan  A: Abdominal pain  Vaginal bleeding   P:  Discharge home Return to MAU if symptoms worsen Bleeding precautions discussed Call the clinic to schedule a follow up visit RX: percocet 5/325 mg PO Q4-6 hours as needed for pain (#20) no rf   Steffany Schoenfelder IRENE FNP-C 11/07/2012, 6:49 PM

## 2012-11-12 ENCOUNTER — Encounter: Payer: Self-pay | Admitting: Obstetrics & Gynecology

## 2012-11-12 ENCOUNTER — Ambulatory Visit (INDEPENDENT_AMBULATORY_CARE_PROVIDER_SITE_OTHER): Payer: Self-pay | Admitting: Obstetrics & Gynecology

## 2012-11-12 VITALS — BP 104/75 | HR 84 | Temp 98.6°F | Ht 68.0 in | Wt 237.1 lb

## 2012-11-12 DIAGNOSIS — R102 Pelvic and perineal pain: Secondary | ICD-10-CM

## 2012-11-12 DIAGNOSIS — N949 Unspecified condition associated with female genital organs and menstrual cycle: Secondary | ICD-10-CM

## 2012-11-12 MED ORDER — TRAMADOL HCL 50 MG PO TABS: 50.0000 mg | ORAL_TABLET | Freq: Four times a day (QID) | ORAL | Status: AC | PRN

## 2012-11-12 NOTE — Progress Notes (Signed)
Subjective:     Patient ID: [name], female   DOB: 1980/07/16, 32 y.o.   MRN: [id number]  HPI Pt was seen in the MAU 5 days prev.  She had an IUD placed 11/07/2012 and went to the MAU with complaints of bleeding and pain.  Pt states that since her visit to the MAU she feels better and the heavy bleeding with clots has resolved.  She    Denies fever or chills.  She is taking Percocet as prescribed in the MAU 1 1/2tabs and only has 4 left.  Exam in MAU reviewed  Review of Systems     Objective:   Physical Exam BP 104/75  Pulse 84  Temp(Src) 98.6 F (37 C)  Ht 5\' 8"  (1.727 m)  Wt 237 lb 1.6 oz (107.548 kg)  BMI 36.06 kg/m2 Exam deferred     Assessment:     Pelvic pain after IUD- sx improved since MAU visit      Plan:     Ultram 50mg  prn with NSAIDs F/u 3 months or sooner prn

## 2012-11-12 NOTE — Progress Notes (Signed)
Pt. Reports severe cramping pain since 11/05/12 after IUD insertion; reported heavy bleeding as well up until Friday 11/07/12. States cramps are coming and going, currently in pain 4/10 with light spotting.

## 2012-11-12 NOTE — Patient Instructions (Addendum)

## 2012-12-05 ENCOUNTER — Ambulatory Visit (INDEPENDENT_AMBULATORY_CARE_PROVIDER_SITE_OTHER): Payer: Self-pay | Admitting: Obstetrics & Gynecology

## 2012-12-05 ENCOUNTER — Encounter: Payer: Self-pay | Admitting: Obstetrics & Gynecology

## 2012-12-05 VITALS — BP 124/75 | HR 100 | Temp 98.1°F | Ht 68.0 in | Wt 238.0 lb

## 2012-12-05 DIAGNOSIS — R102 Pelvic and perineal pain: Secondary | ICD-10-CM

## 2012-12-05 DIAGNOSIS — Z30430 Encounter for insertion of intrauterine contraceptive device: Secondary | ICD-10-CM

## 2012-12-05 DIAGNOSIS — Z30431 Encounter for routine checking of intrauterine contraceptive device: Secondary | ICD-10-CM

## 2012-12-05 DIAGNOSIS — N949 Unspecified condition associated with female genital organs and menstrual cycle: Secondary | ICD-10-CM

## 2012-12-05 MED ORDER — HYDROCODONE-ACETAMINOPHEN 7.5-750 MG PO TABS: 1.0000 | ORAL_TABLET | Freq: Four times a day (QID) | ORAL | Status: AC | PRN

## 2012-12-05 NOTE — Patient Instructions (Signed)

## 2012-12-05 NOTE — Progress Notes (Signed)
Patient ID: [name], female   DOB: 02/24/1980, 32 y.o.   MRN: [id number] History:  32 y.o. G0P0000 here today for today for IUD string check; Mirena IUD was placed  1 month previously. Pt reports that the daily pain has improved but, she continues to have a few days of severe pain not relieved with NSAIDS.  The following portions of the patient's history were reviewed and updated as appropriate: allergies, current medications, past family history, past medical history, past social history, past surgical history and problem list.   Review of Systems:  Pertinent items are noted in HPI.  Objective:  Physical Exam Blood pressure 124/75, pulse 100, temperature 98.1 F (36.7 C), temperature source Oral, height 5\' 8"  (1.727 m), weight 238 lb (107.956 kg), last menstrual period 11/28/2012. Gen: NAD Abd: Soft, nontender and nondistended Pelvic: Normal appearing external genitalia; normal appearing vaginal mucosa and cervix.  IUD strings visualized, about 3 cm in length outside cervix.   Assessment & Plan:  Normal IUD check. Vicodin #10.  Pt notified that there will be no refills. F/u 3 months or sooner prn Patient to keep IUD in place for five years; can come in for removal if she desires pregnancy within the next five years. Routine preventative health maintenance measures emphasized.

## 2013-12-06 ENCOUNTER — Emergency Department (HOSPITAL_COMMUNITY): Payer: Self-pay

## 2013-12-06 ENCOUNTER — Encounter (HOSPITAL_COMMUNITY): Payer: Self-pay | Admitting: Emergency Medicine

## 2013-12-06 ENCOUNTER — Emergency Department (HOSPITAL_COMMUNITY)
Admission: EM | Admit: 2013-12-06 | Discharge: 2013-12-06 | Disposition: A | Discharge status: Home / Self Care | Payer: Self-pay | Attending: Emergency Medicine | Admitting: Emergency Medicine

## 2013-12-06 DIAGNOSIS — F419 Anxiety disorder, unspecified: Secondary | ICD-10-CM | POA: Insufficient documentation

## 2013-12-06 DIAGNOSIS — S8991XA Unspecified injury of right lower leg, initial encounter: Secondary | ICD-10-CM | POA: Insufficient documentation

## 2013-12-06 DIAGNOSIS — G8929 Other chronic pain: Secondary | ICD-10-CM | POA: Insufficient documentation

## 2013-12-06 DIAGNOSIS — Y9389 Activity, other specified: Secondary | ICD-10-CM | POA: Insufficient documentation

## 2013-12-06 DIAGNOSIS — Z8639 Personal history of other endocrine, nutritional and metabolic disease: Secondary | ICD-10-CM | POA: Insufficient documentation

## 2013-12-06 DIAGNOSIS — Z720 Tobacco use: Secondary | ICD-10-CM | POA: Insufficient documentation

## 2013-12-06 DIAGNOSIS — W010XXA Fall on same level from slipping, tripping and stumbling without subsequent striking against object, initial encounter: Secondary | ICD-10-CM | POA: Insufficient documentation

## 2013-12-06 DIAGNOSIS — Z8719 Personal history of other diseases of the digestive system: Secondary | ICD-10-CM | POA: Insufficient documentation

## 2013-12-06 DIAGNOSIS — S8990XA Unspecified injury of unspecified lower leg, initial encounter: Secondary | ICD-10-CM

## 2013-12-06 DIAGNOSIS — Z79899 Other long term (current) drug therapy: Secondary | ICD-10-CM | POA: Insufficient documentation

## 2013-12-06 DIAGNOSIS — F329 Major depressive disorder, single episode, unspecified: Secondary | ICD-10-CM | POA: Insufficient documentation

## 2013-12-06 DIAGNOSIS — Z8739 Personal history of other diseases of the musculoskeletal system and connective tissue: Secondary | ICD-10-CM | POA: Insufficient documentation

## 2013-12-06 DIAGNOSIS — Y9289 Other specified places as the place of occurrence of the external cause: Secondary | ICD-10-CM | POA: Insufficient documentation

## 2013-12-06 MED ORDER — KETOROLAC TROMETHAMINE 60 MG/2ML IM SOLN
60.0000 mg | Freq: Once | INTRAMUSCULAR | Status: AC
  Administered 2013-12-06: 60 mg via INTRAMUSCULAR
  Filled 2013-12-06: qty 2

## 2013-12-06 MED ORDER — OXYCODONE-ACETAMINOPHEN 5-325 MG PO TABS
2.0000 | ORAL_TABLET | Freq: Once | ORAL | Status: AC
  Administered 2013-12-06: 2 via ORAL
  Filled 2013-12-06: qty 2

## 2013-12-06 MED ORDER — IBUPROFEN 800 MG PO TABS: 800.0000 mg | ORAL_TABLET | Freq: Three times a day (TID) | ORAL | Status: AC

## 2013-12-06 MED ORDER — HYDROCODONE-ACETAMINOPHEN 5-325 MG PO TABS: 2.0000 | ORAL_TABLET | ORAL | Status: AC | PRN

## 2013-12-06 NOTE — Discharge Instructions (Signed)

## 2013-12-06 NOTE — ED Notes (Signed)
Pt c/o right knee pain after stepping wrong yesterday

## 2013-12-06 NOTE — ED Provider Notes (Signed)
Medical screening examination/treatment/procedure(s) were performed by non-physician practitioner and as supervising physician I was immediately available for consultation/collaboration.    Vida RollerBrian D Christyl Osentoski, MD 12/06/13 2004

## 2013-12-06 NOTE — ED Provider Notes (Signed)
CSN: [id number]     Arrival date & time 12/06/13  1053 History   First MD Initiated Contact with Patient 12/06/13 1110     Chief Complaint  Patient presents with  . Knee Pain     (Consider location/radiation/quality/duration/timing/severity/associated sxs/prior Treatment) Patient is a 33 y.o. female presenting with knee pain. The history is provided by the patient. No language interpreter was used.  Knee Pain Location:  Knee Time since incident:  1 day Injury: yes   Mechanism of injury: fall   Fall:    Entrapped after fall: no   Knee location:  R knee Pain details:    Quality:  Aching   Radiates to:  Does not radiate   Severity:  Moderate   Timing:  Constant   Progression:  Worsening Chronicity:  New Dislocation: no   Relieved by:  Nothing Worsened by:  Nothing tried Ineffective treatments:  None tried Associated symptoms: no back pain   Risk factors: no concern for non-accidental trauma   Pt took a step onto her knee yesterday. Patient really reports she heard a pop and a crunch patient reports she began having sudden pain. Patient reports she did fall however impact did not cause injury. Patient reports she had an injury to the same knee except for years ago, she has had some difficulty with her right knee since that previous injury. She did not require surgery or orthopedic intervention at that time.  Past Medical History  Diagnosis Date  . Depression   . Anxiety   . Allergy   . Chronic back pain   . Chronic knee pain   . Lumbar radiculopathy   . PCOS (polycystic ovarian syndrome)   . IBS (irritable bowel syndrome)    Past Surgical History  Procedure Laterality Date  . Appendectomy    . Spine surgery  2005   Family History  Problem Relation Age of Onset  . Hypertension Mother    History  Substance Use Topics  . Smoking status: Current Every Day Smoker -- 1.00 packs/day    Types: Cigarettes  . Smokeless tobacco: Never Used  . Alcohol Use: No   OB  History   Grav Para Term Preterm Abortions TAB SAB Ect Mult Living   0 0 0 0 0 0 0 0 0 0      Review of Systems  Musculoskeletal: Negative for back pain.  All other systems reviewed and are negative.     Allergies  Prednisone  Home Medications   Prior to Admission medications   Medication Sig Start Date End Date Taking? Authorizing Provider  albuterol (PROVENTIL HFA;VENTOLIN HFA) 108 (90 BASE) MCG/ACT inhaler Inhale 2 puffs into the lungs every 6 (six) hours as needed for wheezing or shortness of breath (rescue).     Historical Provider, MD  citalopram (CELEXA) 40 MG tablet Take 40 mg by mouth daily.    Historical Provider, MD  HYDROcodone-acetaminophen (VICODIN ES) 7.5-750 MG per tablet Take 1 tablet by mouth every 6 (six) hours as needed for pain. 12/05/12   Willodean Rosenthalarolyn Harraway-Smith, MD  ibuprofen (ADVIL,MOTRIN) 200 MG tablet Take 800 mg by mouth every 8 (eight) hours as needed for pain.    Historical Provider, MD  levonorgestrel (MIRENA) 20 MCG/24HR IUD 1 each by Intrauterine route once.    Historical Provider, MD  Multiple Vitamin (MULTIVITAMIN WITH MINERALS) TABS Take 1 tablet by mouth daily.    Historical Provider, MD  oxyCODONE-acetaminophen (PERCOCET/ROXICET) 5-325 MG per tablet Take 1-2 tablets by mouth every 4 (  four) hours as needed for pain (Q4-6 hours as needed for pain). 11/07/12   Iona HansenJennifer Irene Rasch, NP  sodium chloride (OCEAN) 0.65 % nasal spray Place 1 spray into the nose 2 (two) times daily as needed for congestion.    Historical Provider, MD  traMADol (ULTRAM) 50 MG tablet Take 1 tablet (50 mg total) by mouth every 6 (six) hours as needed for pain. 11/12/12   Willodean Rosenthalarolyn Harraway-Smith, MD   BP 121/59  Pulse 103  Temp(Src) 98.3 F (36.8 C)  Resp 20  SpO2 99% Physical Exam  Nursing note and vitals reviewed. Constitutional: She is oriented to person, place, and time. She appears well-developed and well-nourished.  HENT:  Head: Normocephalic and atraumatic.  Eyes:  Conjunctivae are normal. Pupils are equal, round, and reactive to light.  Neck: Normal range of motion.  Cardiovascular: Normal rate.   Pulmonary/Chest: Effort normal.  Musculoskeletal: She exhibits tenderness.  Pain with full flexion, patient unable to tolerate full extension. No gross medial or lateral instability negative Lockman and drawer. Patient is tender in the popliteal area. Neurovascular and neurosensory are intact  Neurological: She is alert and oriented to person, place, and time. She has normal reflexes.  Skin: Skin is warm.  Psychiatric: She has a normal mood and affect.    ED Course  Procedures (including critical care time) Labs Review Labs Reviewed - No data to display  Imaging Review Dg Knee Complete 4 Views Right  12/06/2013   CLINICAL DATA:  Posterior right knee pain since yesterday  EXAM: RIGHT KNEE - COMPLETE 4+ VIEW  COMPARISON:  03/17/2012  FINDINGS: Normal alignment without fracture or effusion. Preserved joint spaces. No significant soft tissue abnormality.  IMPRESSION: No acute osseous finding.   Electronically Signed   By: Ruel Favorsrevor  Shick M.D.   On: 12/06/2013 12:39     EKG Interpretation None    .  MDM   Final diagnoses:  Knee injury     Knee immobilizer Hydrocodone Ibuprofen Scheduled to see Dr. Lajoyce Cornersuda for evaluation. ABS given   Elson AreasLeslie K Avett Reineck, PA-C 12/06/13 1245

## 2014-10-06 IMAGING — US US TRANSVAGINAL NON-OB
1 series · 14 of 25 positions shown · non-contrast
Comparison: Transabdominal and transvaginal pelvic ultrasound
07/14/2012.

CLINICAL DATA: Intrauterine device inserted 2 days ago, patient
presenting now with vaginal bleeding and cramping. G0. LMP
approximately 4 months ago.

EXAM:
TRANSVAGINAL ULTRASOUND OF PELVIS
TECHNIQUE: Transvaginal ultrasound examination of the pelvis was performed
including evaluation of the uterus, ovaries, adnexal regions, and
pelvic cul-de-sac.

[Series 1: us transvaginal non-ob · 28 acquisitions, 14 frames shown]
[im 1/28]
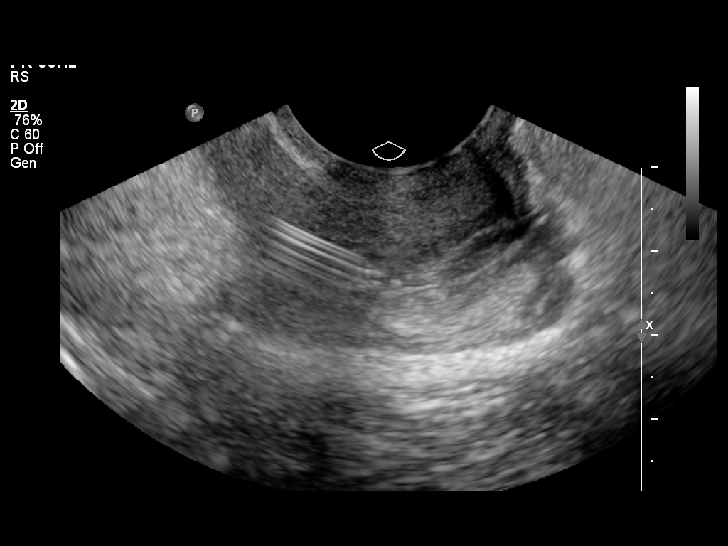
[im 3/28]
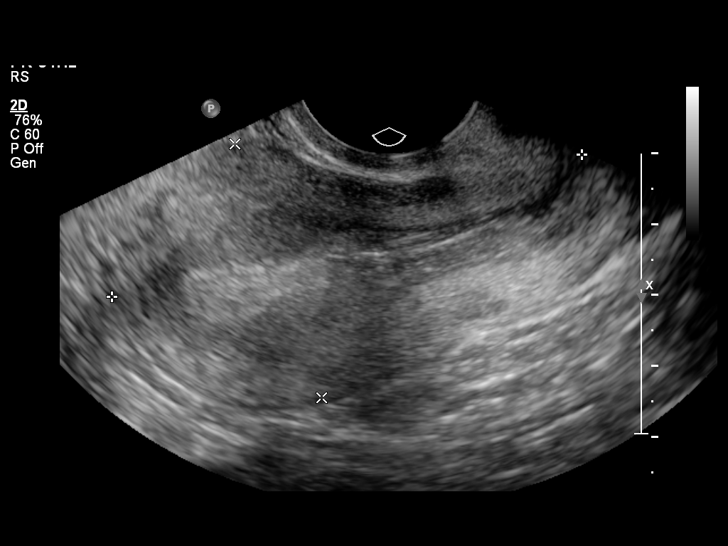
[im 5/28]
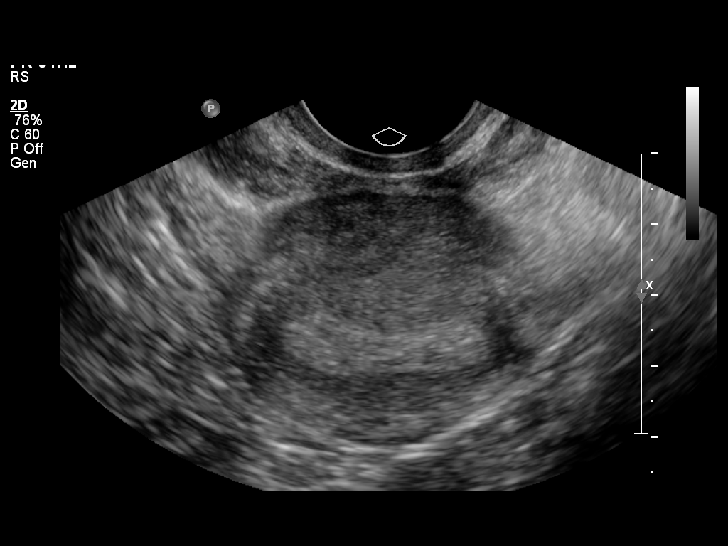
[im 7/28]
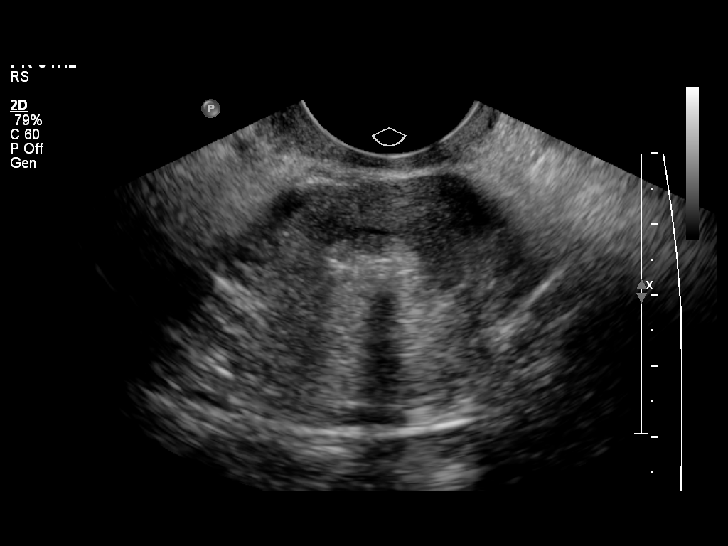
[im 10/28]
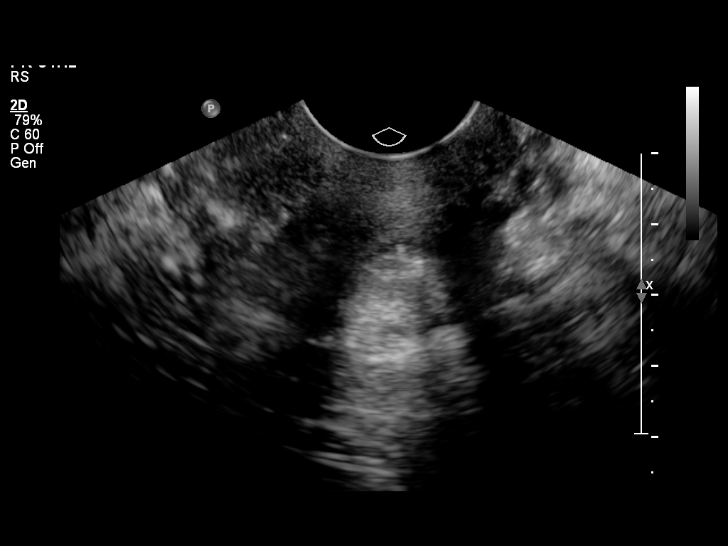
[im 11/28]
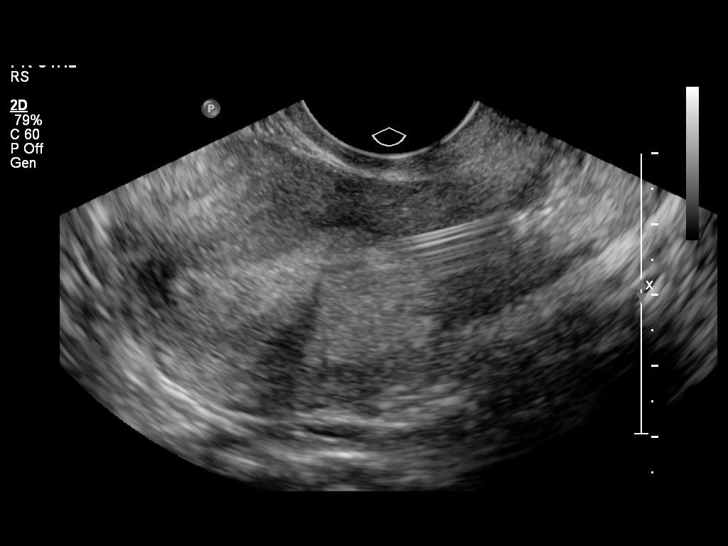
[im 13/28]
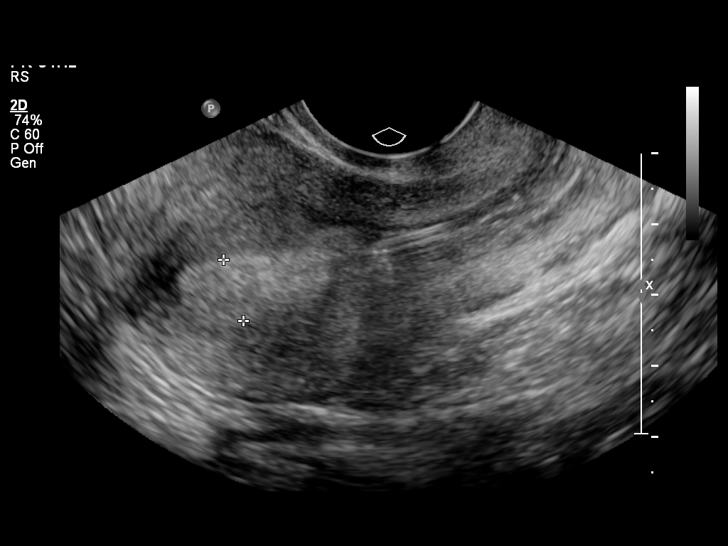
[im 15/28]
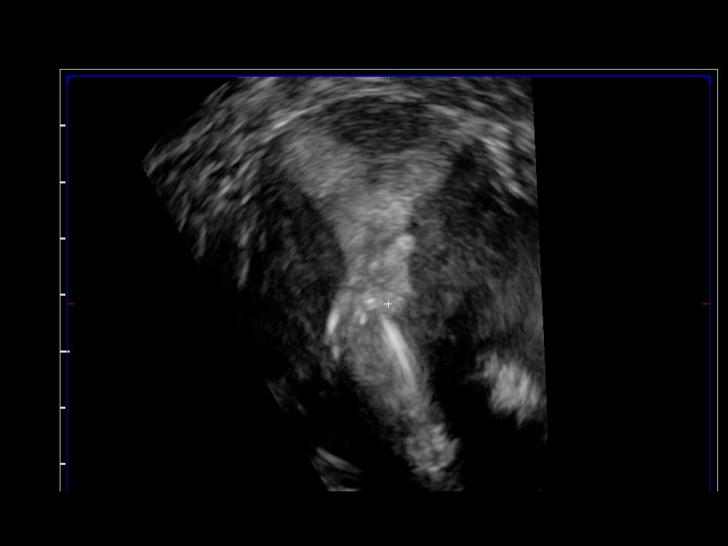
[im 17/28]
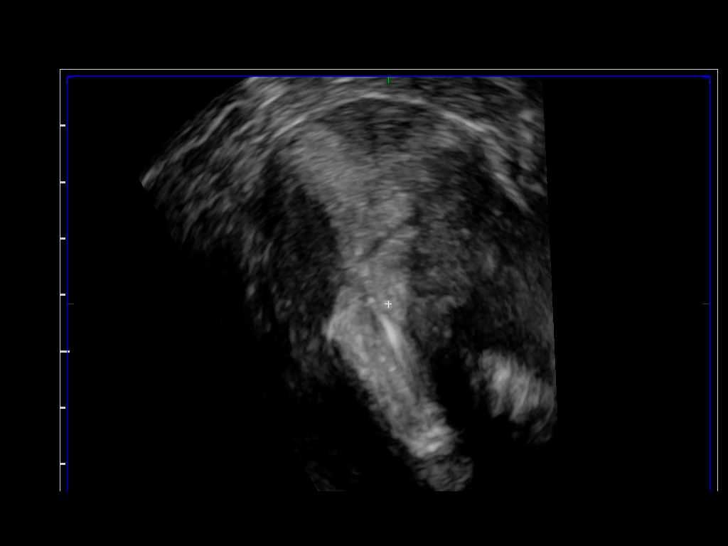
[im 19/28]
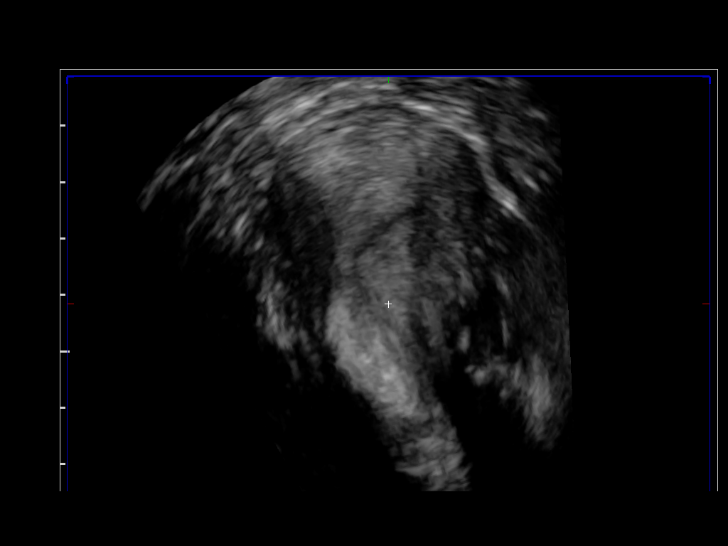
[im 21/28]
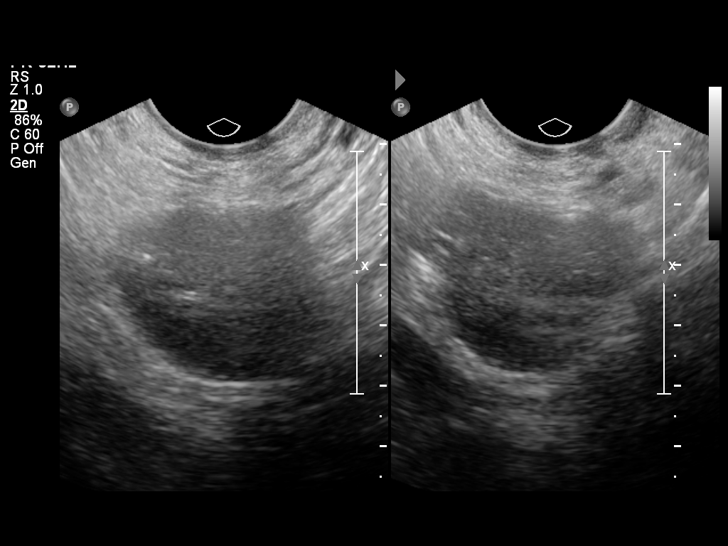
[im 23/28]
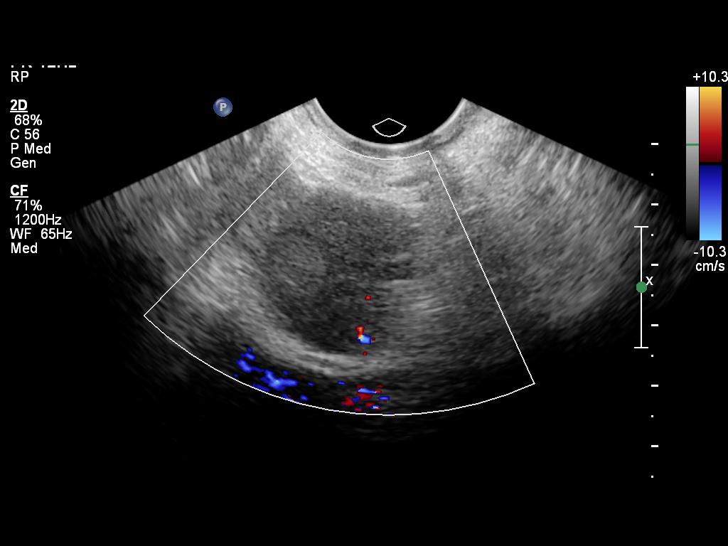
[im 25/28]
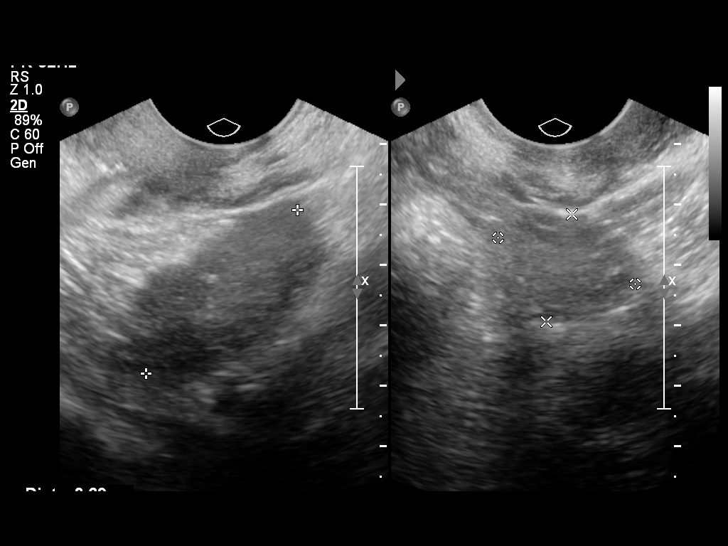
[im 28/28]
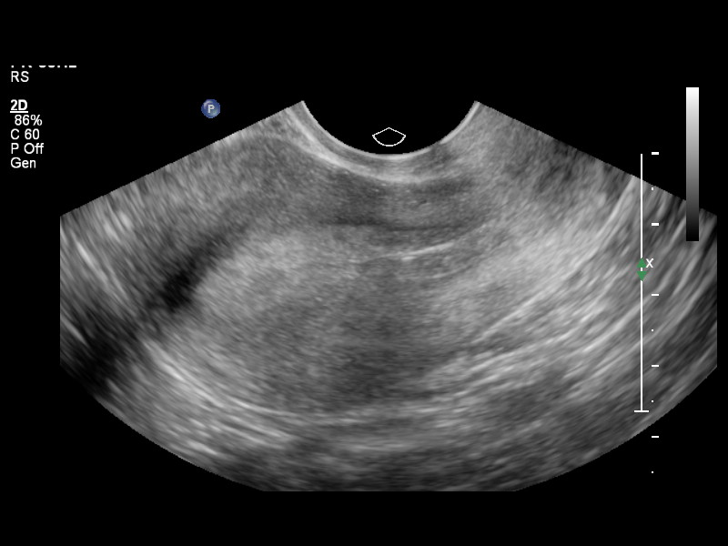

[14 of 25 positions shown; findings below may reference images not displayed]

FINDINGS: Uterus

Measurements: Approximately 6.9 x 3.8 x 4.3 cm. No focal myometrial
abnormality.

Endometrium

Thickness: 9 mm. Intrauterine device positioned in the lower uterine
segment and cervix. No endometrial fluid.

Right ovary

Measurements: Approximately 3.4 x 2.7 x 3.0 cm. No dominant cyst or
solid mass. Normal color Doppler flow within the ovary.

Left ovary

Measurements: Approximately 3.7 x 1.8 x 2.4 cm. No dominant cyst or
solid mass. Normal color Doppler flow within the ovary.

Other findings:  No free fluid.
IMPRESSION: 1. Intrauterine device positioned in the endometrium of the lower
uterine segment and in the cervix, not within the fundal endometrium
as expected.
2. Otherwise normal examination.

## 2015-11-04 IMAGING — CR DG KNEE COMPLETE 4+V*R*
4 series · 4 of 4 positions shown · non-contrast
Comparison: 03/17/2012

CLINICAL DATA: Posterior right knee pain since yesterday

EXAM:
RIGHT KNEE - COMPLETE 4+ VIEW

[t knee ap right]
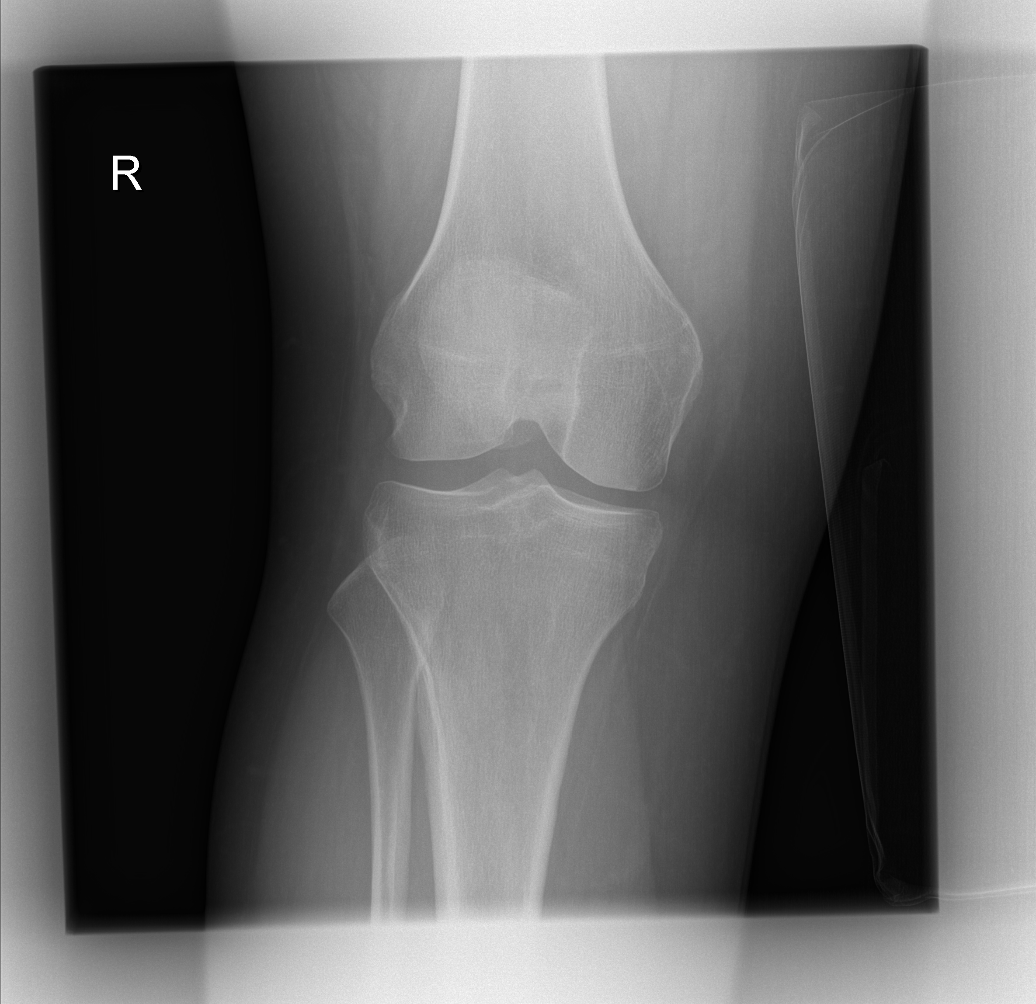

[t knee oblique right (1 of 2)]
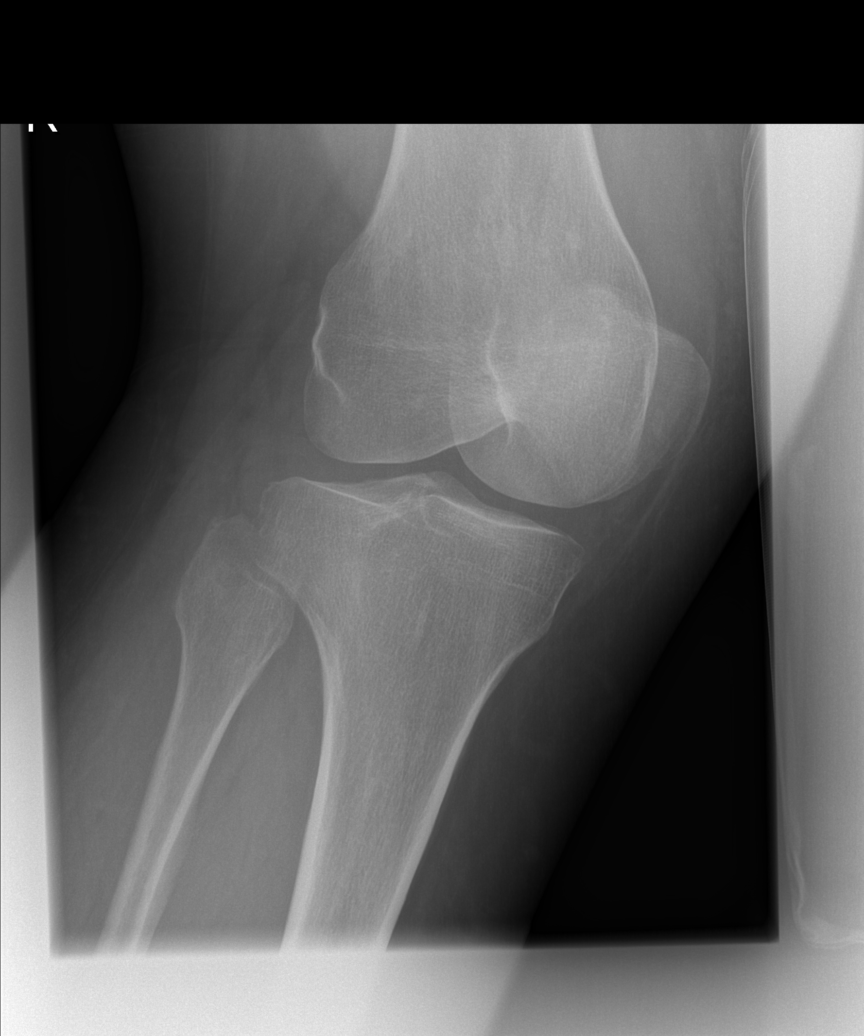

[t knee oblique right (2 of 2)]
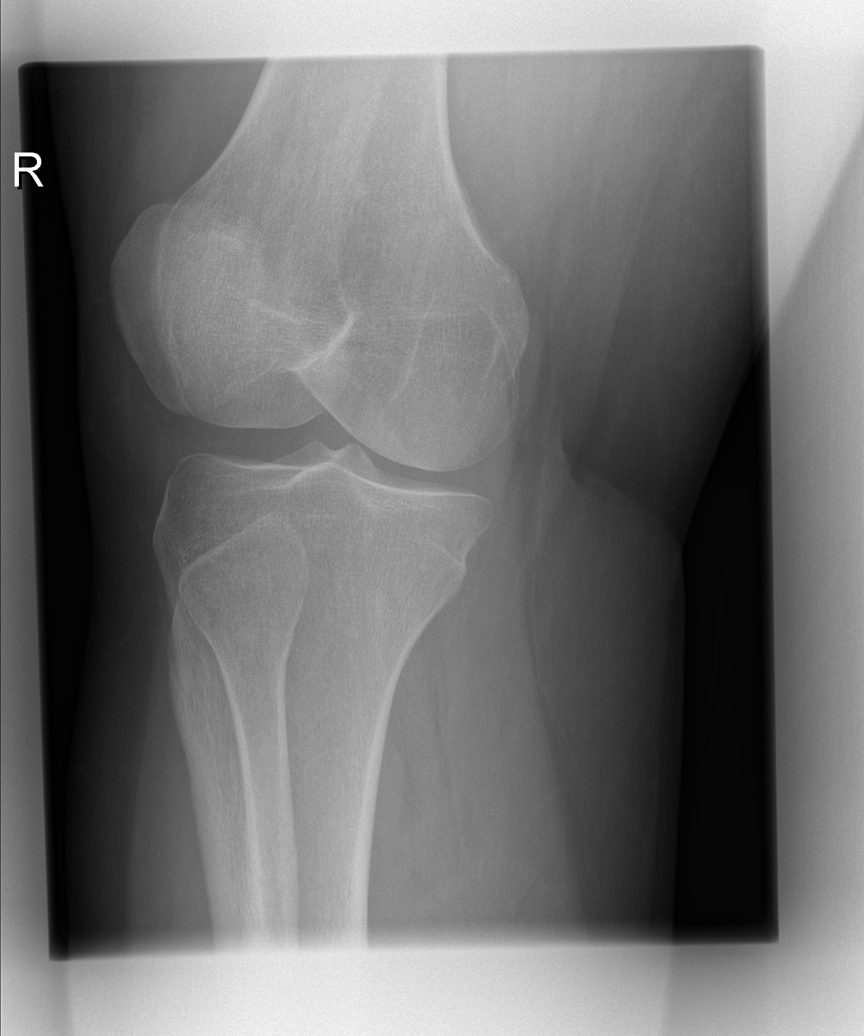

[t knee lat right]
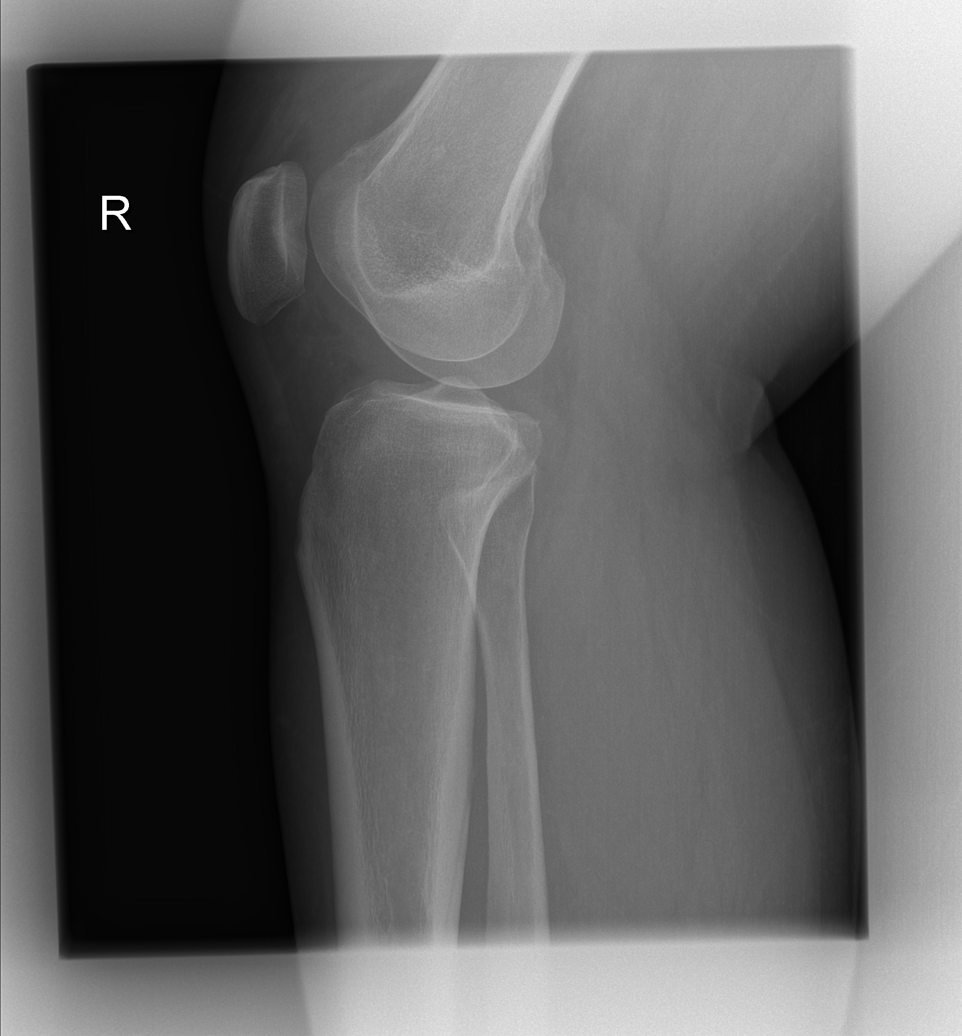

[4 of 4 positions shown; findings below may reference images not displayed]

FINDINGS: Normal alignment without fracture or effusion. Preserved joint
spaces. No significant soft tissue abnormality.
IMPRESSION: No acute osseous finding.

## 2016-09-17 ENCOUNTER — Emergency Department (HOSPITAL_COMMUNITY)
Admission: EM | Admit: 2016-09-17 | Discharge: 2016-09-17 | Disposition: A | Discharge status: Home / Self Care | Payer: Self-pay | Attending: Emergency Medicine | Admitting: Emergency Medicine

## 2016-09-17 ENCOUNTER — Encounter (HOSPITAL_COMMUNITY): Payer: Self-pay

## 2016-09-17 DIAGNOSIS — Z79899 Other long term (current) drug therapy: Secondary | ICD-10-CM | POA: Insufficient documentation

## 2016-09-17 DIAGNOSIS — L0291 Cutaneous abscess, unspecified: Secondary | ICD-10-CM

## 2016-09-17 DIAGNOSIS — Z23 Encounter for immunization: Secondary | ICD-10-CM | POA: Insufficient documentation

## 2016-09-17 DIAGNOSIS — L02416 Cutaneous abscess of left lower limb: Secondary | ICD-10-CM | POA: Insufficient documentation

## 2016-09-17 DIAGNOSIS — F17210 Nicotine dependence, cigarettes, uncomplicated: Secondary | ICD-10-CM | POA: Insufficient documentation

## 2016-09-17 MED ORDER — CEPHALEXIN 250 MG PO CAPS
500.0000 mg | ORAL_CAPSULE | Freq: Once | ORAL | Status: AC
  Administered 2016-09-17: 500 mg via ORAL
  Filled 2016-09-17: qty 2

## 2016-09-17 MED ORDER — FLUCONAZOLE 100 MG PO TABS
150.0000 mg | ORAL_TABLET | Freq: Once | ORAL | Status: AC
  Administered 2016-09-17: 150 mg via ORAL
  Filled 2016-09-17: qty 2

## 2016-09-17 MED ORDER — LIDOCAINE-EPINEPHRINE 2 %-1:200000 IJ SOLN
10.0000 mL | Freq: Once | INTRAMUSCULAR | Status: AC
Start: 2016-09-17 — End: 2016-09-17
  Administered 2016-09-17: 10 mL
  Filled 2016-09-17: qty 20

## 2016-09-17 MED ORDER — CEPHALEXIN 500 MG PO CAPS: 500.0000 mg | ORAL_CAPSULE | Freq: Four times a day (QID) | ORAL | 0 refills | Status: AC

## 2016-09-17 MED ORDER — TETANUS-DIPHTH-ACELL PERTUSSIS 5-2.5-18.5 LF-MCG/0.5 IM SUSP
0.5000 mL | Freq: Once | INTRAMUSCULAR | Status: AC
Start: 2016-09-17 — End: 2016-09-17
  Administered 2016-09-17: 0.5 mL via INTRAMUSCULAR
  Filled 2016-09-17: qty 0.5

## 2016-09-17 NOTE — ED Triage Notes (Signed)
  PT arrives POV with complaints of abscess to left upper thigh. Denies fevers or chills

## 2016-09-17 NOTE — Discharge Instructions (Signed)
You may remove the bandage after 24 hours if it has stopped draining. Clean the wound and surrounding area gently with tap water and mild soap. Rinse well and blot dry. You may also use Epsom salt soaks to encourage wound healing. Clean the wound daily to prevent infection. Do not use cleaners such as hydrogen peroxide or alcohol.   Scar reduction: Application of a topical antibiotic ointment, such as Neosporin, after the wound has begun to close and heal well can decrease scab formation and reduce scarring. After the wound has healed, application of ointments such as Aquaphor can also reduce scar formation.  The key to scar reduction is keeping the skin well hydrated and supple. Drinking plenty of water throughout the day (At least eight 8oz glasses of water a day) is essential to staying well hydrated.  Sun exposure: Keep the wound out of the sun. After the wound has healed, continue to protect it from the sun by wearing protective clothing or applying sunscreen.  Pain: You may use Tylenol, naproxen, or ibuprofen for pain.  Return to the ED should signs of infection arise, such as spreading redness, puffiness/swelling, pus draining from the wound, severe increase in pain, fever over 100.65F, or any other major issues.

## 2016-09-17 NOTE — ED Provider Notes (Signed)
[facility]-EMERGENCY DEPT Provider Note   CSN: [id number] Arrival date & time: 09/17/16  1706  By signing my name below, I, [name], attest that this documentation has been prepared under the direction and in the presence of [name], PA-C. Electronically Signed: Diona Alvarez, ED Scribe. 09/17/16. 9:13 PM.  History   Chief Complaint No chief complaint on file.   HPI [name] is a 36 y.o. female who presents to the Emergency Department complaining of a small, painful mass to the left upper inner thigh beginning a week ago, worsening yesterday. Endorses an area of surrounding redness. Pain is moderate, sharp, nonradiating. She has been using warm compresses and taking ibuprofen with no relief. No recent tick bites. Tetanus is NOT UTD. Pt denies rash, joint pain, fever, or any other complaints at this time.     The history is provided by the patient. No language interpreter was used.    Past Medical History:  Diagnosis Date  . Allergy   . Anxiety   . Chronic back pain   . Chronic knee pain   . Depression   . IBS (irritable bowel syndrome)   . Lumbar radiculopathy   . PCOS (polycystic ovarian syndrome)     Patient Active Problem List   Diagnosis Date Noted  . PCOS (polycystic ovarian syndrome) 08/07/2012  . DUB (dysfunctional uterine bleeding) 08/07/2012  . Chronic low back pain 12/25/2011    Past Surgical History:  Procedure Laterality Date  . APPENDECTOMY    . SPINE SURGERY  2005    OB History    Gravida Para Term Preterm AB Living   0 0 0 0 0 0   SAB TAB Ectopic Multiple Live Births   0 0 0 0         Home Medications    Prior to Admission medications   Medication Sig Start Date End Date Taking? Authorizing Provider  albuterol (PROVENTIL HFA;VENTOLIN HFA) 108 (90 BASE) MCG/ACT inhaler Inhale 2 puffs into the lungs every 6 (six) hours as needed for wheezing or shortness of breath (rescue).     [provider]  cephALEXin (KEFLEX) 500 MG  capsule Take 1 capsule (500 mg total) by mouth 4 (four) times daily. 09/17/16   Tedi Hughson C, PA-C  citalopram (CELEXA) 40 MG tablet Take 40 mg by mouth daily.    [provider]  HYDROcodone-acetaminophen (NORCO/VICODIN) 5-325 MG per tablet Take 2 tablets by mouth every 4 (four) hours as needed. 12/06/13   Elson Areas, PA-C  HYDROcodone-acetaminophen (VICODIN ES) 7.5-750 MG per tablet Take 1 tablet by mouth every 6 (six) hours as needed for pain. 12/05/12   Willodean Rosenthal, MD  ibuprofen (ADVIL,MOTRIN) 200 MG tablet Take 800 mg by mouth every 8 (eight) hours as needed for pain.    [provider]  ibuprofen (ADVIL,MOTRIN) 800 MG tablet Take 1 tablet (800 mg total) by mouth 3 (three) times daily. 12/06/13   Elson Areas, PA-C  levonorgestrel (MIRENA) 20 MCG/24HR IUD 1 each by Intrauterine route once.    [provider]  Multiple Vitamin (MULTIVITAMIN WITH MINERALS) TABS Take 1 tablet by mouth daily.    [provider]  oxyCODONE-acetaminophen (PERCOCET/ROXICET) 5-325 MG per tablet Take 1-2 tablets by mouth every 4 (four) hours as needed for pain (Q4-6 hours as needed for pain). 11/07/12   Rasch, Victorino Dike I, NP  sodium chloride (OCEAN) 0.65 % nasal spray Place 1 spray into the nose 2 (two) times daily as needed for  congestion.    [provider]  traMADol (ULTRAM) 50 MG tablet Take 1 tablet (50 mg total) by mouth every 6 (six) hours as needed for pain. 11/12/12   Willodean RosenthalHarraway-Smith, Carolyn, MD    Family History Family History  Problem Relation Age of Onset  . Hypertension Mother     Social History Social History  Substance Use Topics  . Smoking status: Current Every Day Smoker    Packs/day: 1.00    Types: Cigarettes  . Smokeless tobacco: Never Used  . Alcohol use No     Allergies   Prednisone   Review of Systems Review of Systems  Constitutional: Negative for fever.  Musculoskeletal: Negative for arthralgias and myalgias.    Skin: Positive for color change. Negative for rash.  Neurological: Negative for weakness, numbness and headaches.     Physical Exam Updated Vital Signs BP 123/85   Pulse 89   Temp 98.2 F (36.8 C) (Oral)   Resp 16   Ht 5\' 9"  (1.753 m)   Wt 271 lb (122.9 kg)   SpO2 99%   BMI 40.02 kg/m   Physical Exam  Constitutional: She appears well-developed and well-nourished. No distress.  HENT:  Head: Normocephalic and atraumatic.  Eyes: Conjunctivae are normal.  Neck: Neck supple.  Cardiovascular: Normal rate and regular rhythm.   Pulmonary/Chest: Effort normal.  Musculoskeletal: She exhibits tenderness.  Neurological: She is alert.  Skin: Skin is warm and dry. She is not diaphoretic. There is erythema.  Area of induration 1-1.5 cm in diameter to the left inner thigh tender to touch. Area of surrounding erythema, 6.5 by 5 cm. No active drainage.  Psychiatric: She has a normal mood and affect. Her behavior is normal.  Nursing note and vitals reviewed.    ED Treatments / Results  Labs (all labs ordered are listed, but only abnormal results are displayed) Labs Reviewed - No data to display  EKG  EKG Interpretation None       Radiology No results found.  Procedures .Marland Kitchen.Incision and Drainage Date/Time: 09/17/2016 8:50 PM Performed by: Anselm PancoastJOY, Azad Calame C Authorized by: Harolyn RutherfordJOY, Bryler Dibble C   Consent:    Consent obtained:  Verbal   Consent given by:  Patient   Risks discussed:  Bleeding, incomplete drainage, pain and infection Location:    Type:  Abscess   Size:  1cm   Location:  Lower extremity   Lower extremity location:  Leg   Leg location:  L upper leg Pre-procedure details:    Skin preparation:  Betadine Anesthesia (see MAR for exact dosages):    Anesthesia method:  Local infiltration   Local anesthetic:  Lidocaine 2% WITH epi Procedure type:    Complexity:  Simple Procedure details:    Incision types:  Single straight   Incision depth:  Subcutaneous   Scalpel blade:   11   Drainage:  Bloody and purulent   Drainage amount:  Scant   Wound treatment:  Wound left open   Packing materials:  None Post-procedure details:    Patient tolerance of procedure:  Tolerated well, no immediate complications   (including critical care time)  EMERGENCY DEPARTMENT US SOFT TISSUE INTERPRETATION "Study: Limited Soft Tissue Ultrasound"  INDICATIONS: Pain and Soft tissue infection Multiple views of the body part were obtained in real-time with a multi-frequency linear probe  PERFORMED BY: Myself IMAGES ARCHIVED?: Yes SIDE:Left BODY PART:Lower extremity INTERPRETATION:  Abcess present and Cellulitis present    Medications Ordered in ED Medications  lidocaine-EPINEPHrine (XYLOCAINE W/EPI) 2 %-  1:200000 (PF) injection 10 mL (10 mLs Infiltration Given 09/17/16 2155)  Tdap (BOOSTRIX) injection 0.5 mL (0.5 mLs Intramuscular Given 09/17/16 2155)  cephALEXin (KEFLEX) capsule 500 mg (500 mg Oral Given 09/17/16 2155)  fluconazole (DIFLUCAN) tablet 150 mg (150 mg Oral Given 09/17/16 2155)     Initial Impression / Assessment and Plan / ED Course  I have reviewed the triage vital signs and the nursing notes.  Pertinent labs & imaging results that were available during my care of the patient were reviewed by me and considered in my medical decision making (see chart for details).     Patient presents with Small abscess on the left thigh with surrounding cellulitis. I&D performed without immediate complication. Patient requests Diflucan in conjunction with her antibiotic therapy. PCP follow-up for any further management. Resources given. The patient was given instructions for home care as well as return precautions. Patient voices understanding of these instructions, accepts the plan, and is comfortable with discharge.    Final Clinical Impressions(s) / ED Diagnoses   Final diagnoses:  Abscess    New Prescriptions Discharge Medication List as of 09/17/2016 10:36 PM    START  taking these medications   Details  cephALEXin (KEFLEX) 500 MG capsule Take 1 capsule (500 mg total) by mouth 4 (four) times daily., Starting Mon 09/17/2016, Print       I personally performed the services described in this documentation, which was scribed in my presence. The recorded information has been reviewed and is accurate.    Anselm Pancoast, PA-C 09/19/16 0218    Tilden Fossa, MD 09/19/16 1341
